# Patient Record
Sex: Male | Born: 1970 | Race: Black or African American | Hispanic: No | Marital: Single | Smoking: Current some day smoker
Health system: Southern US, Community
[De-identification: ages and names within clinical notes are randomized; demographics above are authoritative.]

## PROBLEM LIST (undated history)

## (undated) DIAGNOSIS — R42 Dizziness and giddiness: Secondary | ICD-10-CM

---

## 2003-03-15 HISTORY — PX: HAND SURGERY: SHX662

## 2006-07-14 HISTORY — PX: TESTICLE SURGERY: SHX794

## 2006-09-07 ENCOUNTER — Other Ambulatory Visit: Payer: Self-pay

## 2006-09-07 ENCOUNTER — Emergency Department: Payer: Self-pay | Admitting: Emergency Medicine

## 2007-05-04 ENCOUNTER — Ambulatory Visit: Payer: Self-pay | Admitting: Urology

## 2008-03-16 IMAGING — CR DG CHEST 2V
1 series · 3 of 3 positions shown · non-contrast
Comparison: none

REASON FOR EXAM: Chest pain
COMMENTS:

PROCEDURE:     DXR - DXR CHEST PA (OR AP) AND LATERAL  - September 07, 2006  [DATE]
RESULT:     PA and lateral views of the chest show the lung fields to be
clear. The heart, mediastinal and osseous structures show no acute changes.
Monitoring electrodes are present.

[Series 1: view not recorded · 0.17mm/px · 3 of 3 slices shown]
[im 1/3]
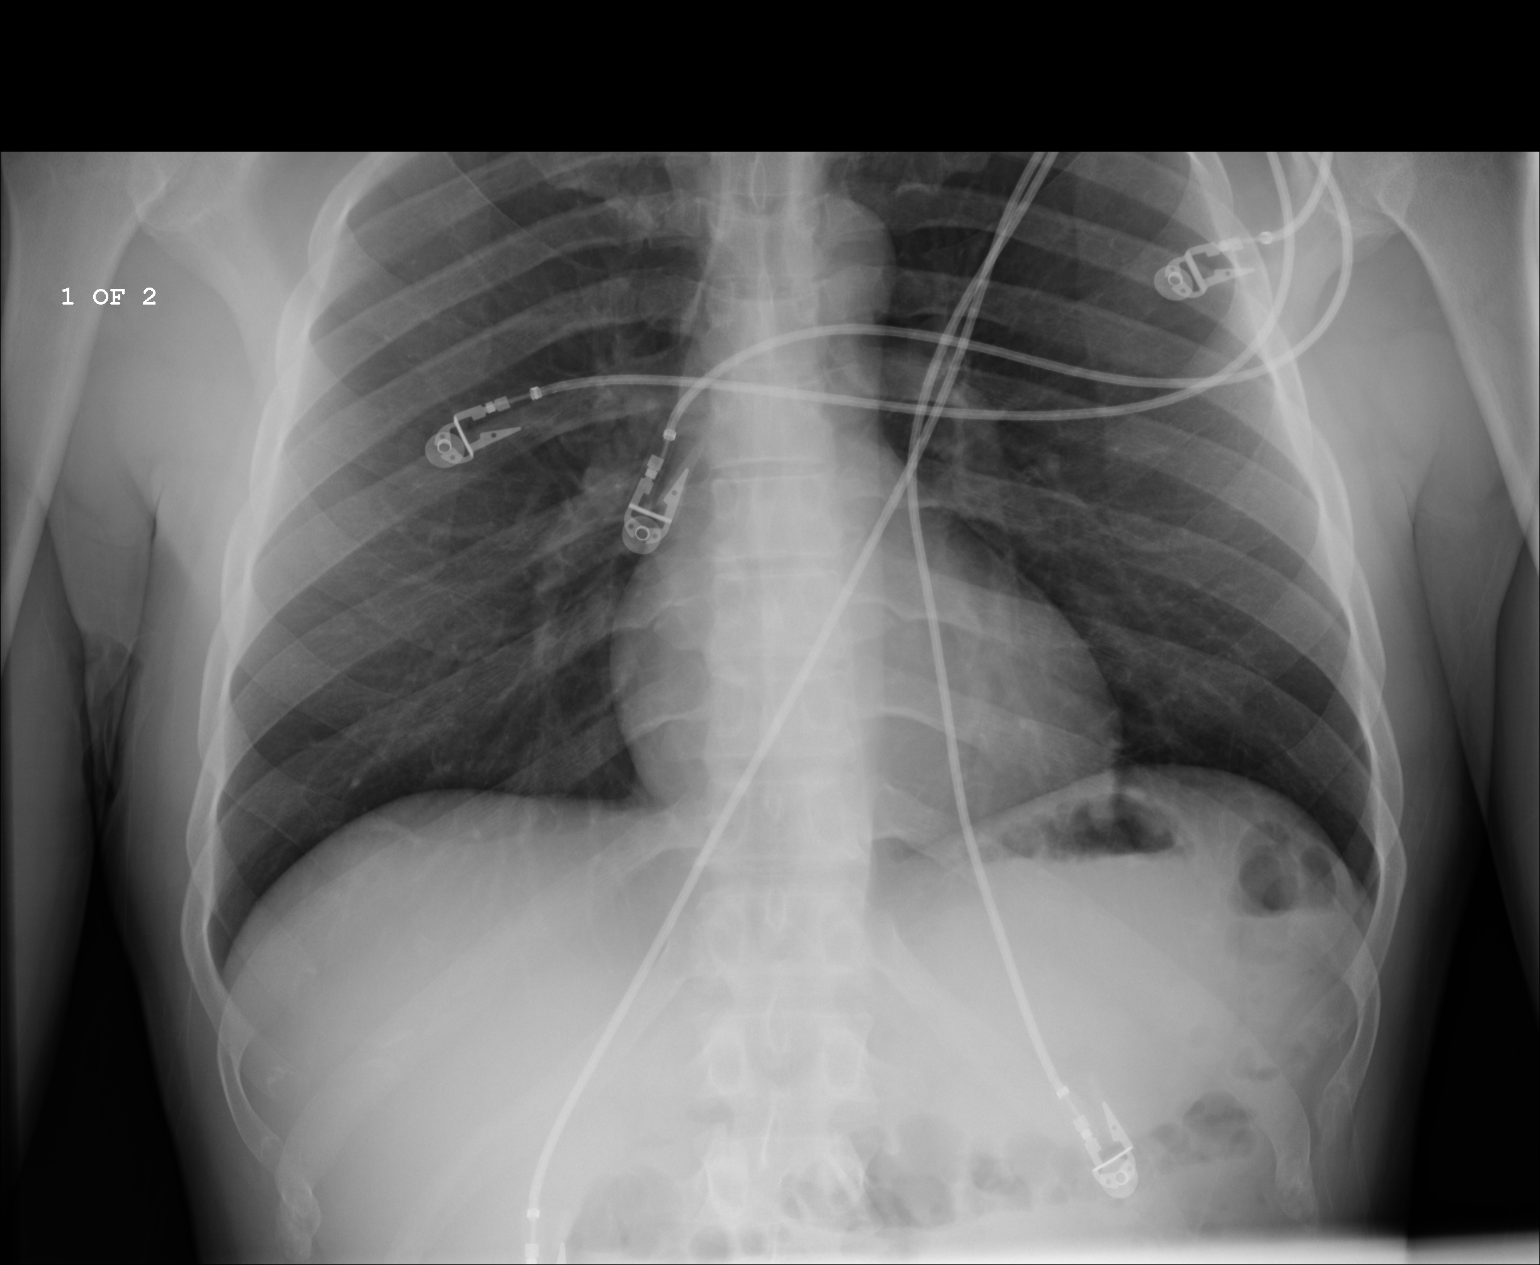
[im 2/3]
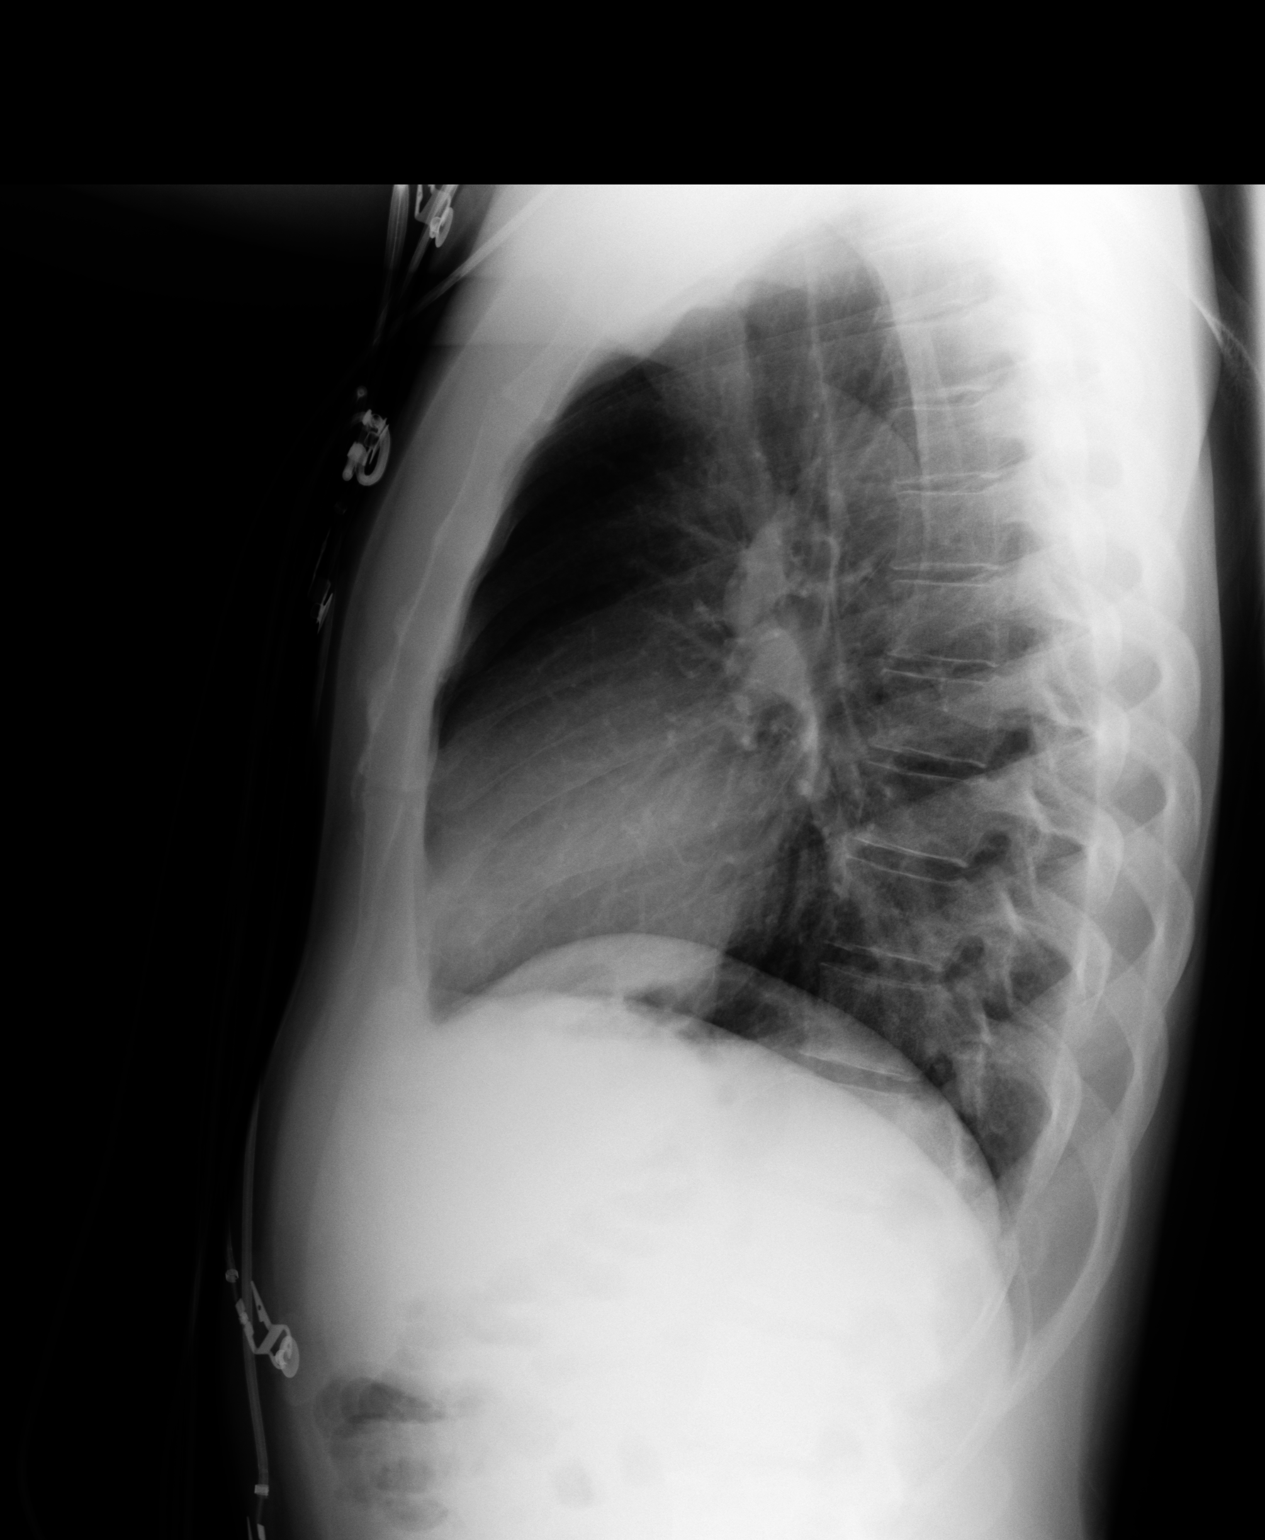
[im 3/3]
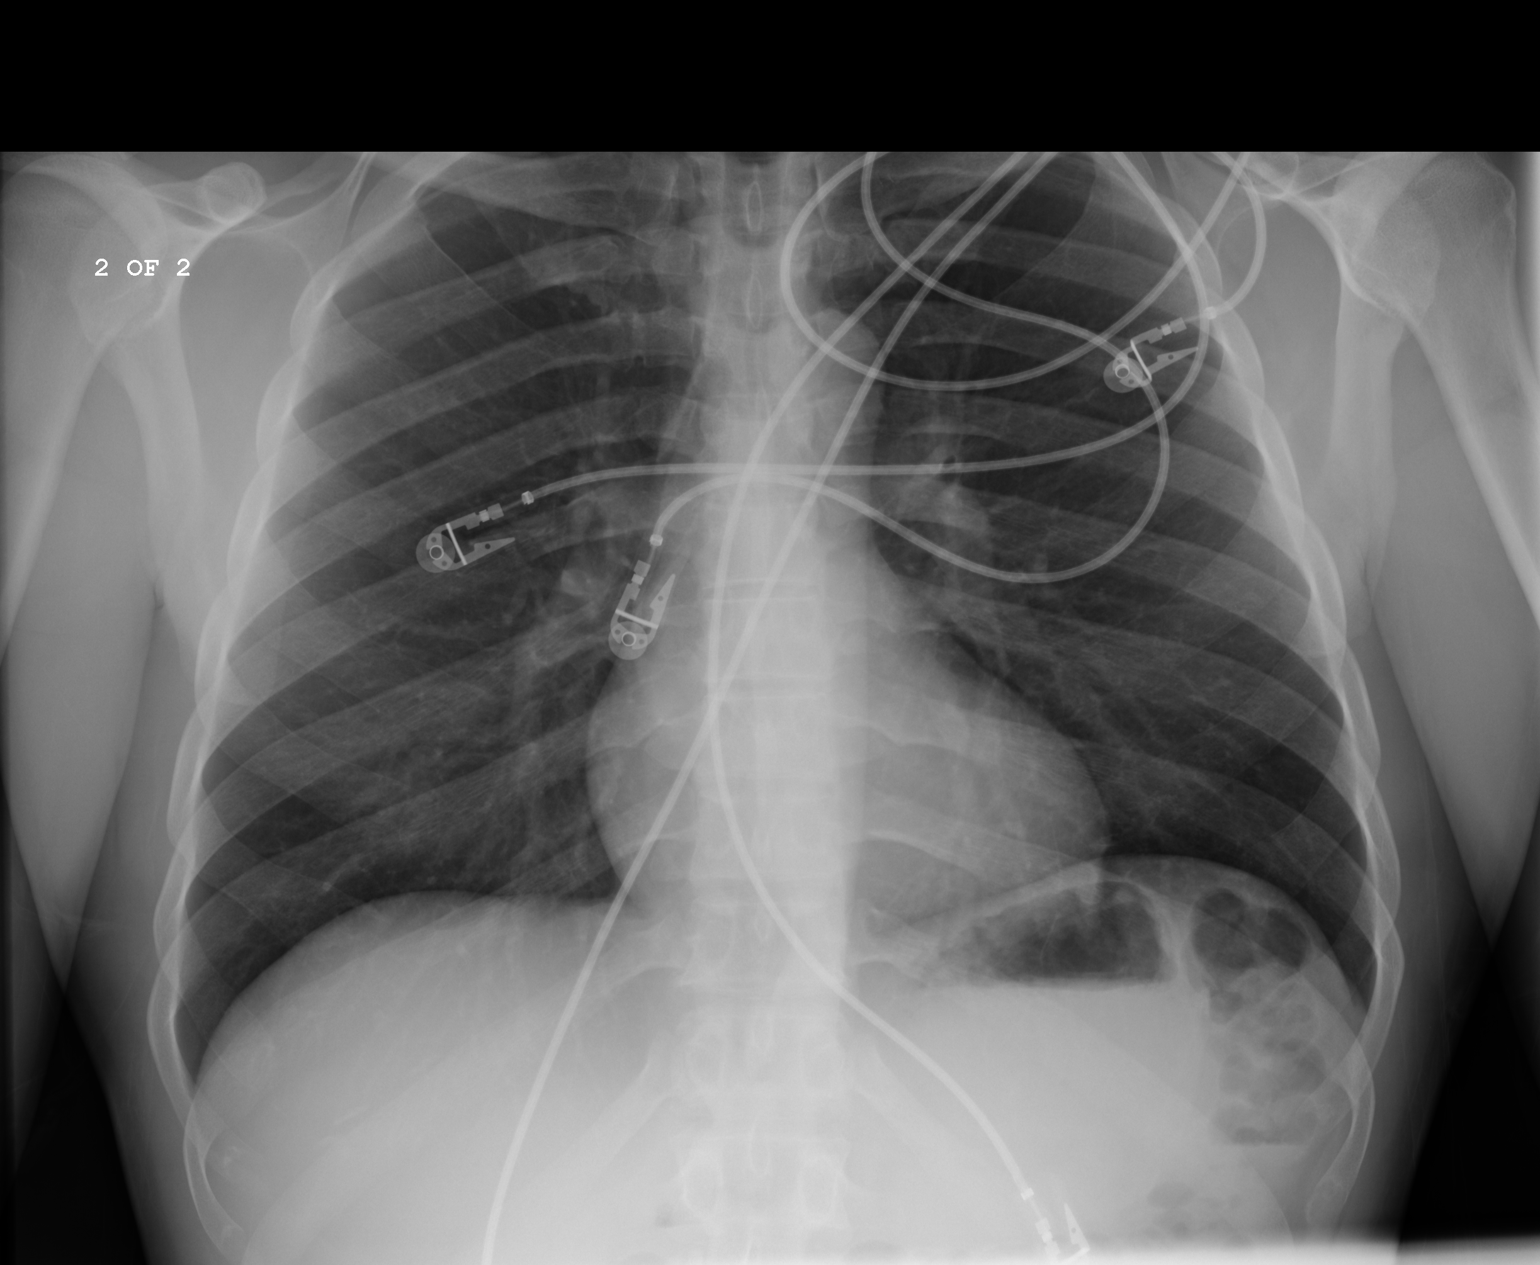

[3 of 3 positions shown; findings below may reference images not displayed]

IMPRESSION: No acute changes are identified.

## 2011-04-03 ENCOUNTER — Ambulatory Visit: Payer: Self-pay | Admitting: Urology

## 2011-06-17 ENCOUNTER — Ambulatory Visit: Payer: Self-pay

## 2011-07-04 ENCOUNTER — Ambulatory Visit: Payer: Self-pay | Admitting: Internal Medicine

## 2015-01-01 ENCOUNTER — Telehealth: Payer: Self-pay | Admitting: Family Medicine

## 2015-01-01 NOTE — Telephone Encounter (Signed)
Patient called and stated he is having knee surgery on June 30 and would like Dr. Thana Ates to fill out paperwork for surgical clearance. Patient was informed that he transferred to a new provider and has not been here in a year that the new provider must do his surgical clearance. He is no longer consider a patient her. Patient stated he undertood and will call his new provider.

## 2015-01-01 NOTE — Telephone Encounter (Signed)
PT SAID THAT HE SWITCHED PROVIDERS IN FEB BUT IS NEEDING A FORM COMPLETED FOR HIM TO HAVE SURGERY SINCE HE WAS SEEING HIM FOR SO LONG.PT SAID THAT HE WILL PAY FOR THIS IF IS NEEDED. PLEASE CALL AND ADVISE. PT # [phone]

## 2015-01-03 ENCOUNTER — Encounter: Payer: Self-pay | Admitting: *Deleted

## 2015-01-10 NOTE — Discharge Instructions (Signed)
[facility] °[facility] ° °POST OPERATIVE INSTRUCTIONS FOR DR. TROXLER AND DR. FOWLER °[facility] ° ° °1. Take your medication as prescribed.  Pain medication should be taken only as needed. ° °2. Keep the dressing clean, dry and intact. ° °3. Keep your foot elevated above the heart level for the first 48 hours. ° °4. Walking to the bathroom and brief periods of walking are acceptable, unless we have instructed you to be non-weight bearing. ° °5. Always wear your post-op shoe when walking.  Always use your crutches if you are to be non-weight bearing. ° °6. Do not take a shower. Baths are permissible as long as the foot is kept out of the water.  ° °7. Every hour you are awake:  °- Bend your knee 15 times. °- Flex foot 15 times °- Massage calf 15 times ° °8. Call [facility] ([phone]) if any of the following problems occur: °- You develop a temperature or fever. °- The bandage becomes saturated with blood. °- Medication does not stop your pain. °- Injury of the foot occurs. °- Any symptoms of infection including redness, odor, or red streaks running from wound. °-  ° °General Anesthesia, Care After °Refer to this sheet in the next few weeks. These instructions provide you with information on caring for yourself after your procedure. Your health care provider may also give you more specific instructions. Your treatment has been planned according to current medical practices, but problems sometimes occur. Call your health care provider if you have any problems or questions after your procedure. °WHAT TO EXPECT AFTER THE PROCEDURE °After the procedure, it is typical to experience: °· Sleepiness. °· Nausea and vomiting. °HOME CARE INSTRUCTIONS °· For the first 24 hours after general anesthesia: °¨ Have a responsible person with you. °¨ Do not drive a car. If you are alone, do not take public transportation. °¨ Do not drink alcohol. °¨ Do not take medicine  that has not been prescribed by your health care provider. °¨ Do not sign important papers or make important decisions. °¨ You may resume a normal diet and activities as directed by your health care provider. °· Change bandages (dressings) as directed. °· If you have questions or problems that seem related to general anesthesia, call the hospital and ask for the anesthetist or anesthesiologist on call. °SEEK MEDICAL CARE IF: °· You have nausea and vomiting that continue the day after anesthesia. °· You develop a rash. °SEEK IMMEDIATE MEDICAL CARE IF:  °· You have difficulty breathing. °· You have chest pain. °· You have any allergic problems. °Document Released: 10/06/2000 Document Revised: 07/05/2013 Document Reviewed: 01/13/2013 °ExitCare® Patient Information ©2015 [employer]. This information is not intended to replace advice given to you by your health care provider. Make sure you discuss any questions you have with your health care provider. ° °

## 2015-01-11 ENCOUNTER — Ambulatory Visit: Payer: Managed Care, Other (non HMO) | Admitting: Anesthesiology

## 2015-01-11 ENCOUNTER — Ambulatory Visit
Admission: RE | Admit: 2015-01-11 | Discharge: 2015-01-11 | Disposition: A | Discharge status: Home / Self Care | Payer: Managed Care, Other (non HMO) | Source: Ambulatory Visit | Attending: Podiatry | Admitting: Podiatry

## 2015-01-11 ENCOUNTER — Encounter
Admission: RE | Disposition: A | Discharge status: Home / Self Care | Payer: Self-pay | Source: Ambulatory Visit | Attending: Podiatry

## 2015-01-11 DIAGNOSIS — G8929 Other chronic pain: Secondary | ICD-10-CM | POA: Insufficient documentation

## 2015-01-11 DIAGNOSIS — Z79899 Other long term (current) drug therapy: Secondary | ICD-10-CM | POA: Diagnosis not present

## 2015-01-11 DIAGNOSIS — M7661 Achilles tendinitis, right leg: Secondary | ICD-10-CM | POA: Insufficient documentation

## 2015-01-11 DIAGNOSIS — M7660 Achilles tendinitis, unspecified leg: Secondary | ICD-10-CM | POA: Diagnosis present

## 2015-01-11 DIAGNOSIS — Z87891 Personal history of nicotine dependence: Secondary | ICD-10-CM | POA: Insufficient documentation

## 2015-01-11 HISTORY — PX: ACHILLES TENDON SURGERY: SHX542

## 2015-01-11 HISTORY — PX: CALCANEAL OSTEOTOMY: SHX1281

## 2015-01-11 HISTORY — DX: Dizziness and giddiness: R42

## 2015-01-11 SURGERY — REPAIR, TENDON, ACHILLES
Anesthesia: General | Laterality: Right

## 2015-01-11 MED ORDER — PROPOFOL 10 MG/ML IV BOLUS
INTRAVENOUS | Status: DC | PRN
  Administered 2015-01-11: 50 mg via INTRAVENOUS
  Administered 2015-01-11: 150 mg via INTRAVENOUS

## 2015-01-11 MED ORDER — ROCURONIUM BROMIDE 100 MG/10ML IV SOLN
INTRAVENOUS | Status: DC | PRN
  Administered 2015-01-11: 30 mg via INTRAVENOUS

## 2015-01-11 MED ORDER — HYDROMORPHONE HCL 1 MG/ML IJ SOLN: 0.2500 mg | INTRAMUSCULAR | Status: DC | PRN

## 2015-01-11 MED ORDER — CEFAZOLIN SODIUM-DEXTROSE 2-3 GM-% IV SOLR
2.0000 g | Freq: Once | INTRAVENOUS | Status: AC
  Administered 2015-01-11: 2 g via INTRAVENOUS

## 2015-01-11 MED ORDER — MIDAZOLAM HCL 2 MG/2ML IJ SOLN
INTRAMUSCULAR | Status: DC | PRN
  Administered 2015-01-11 (×2): 2 mg via INTRAVENOUS

## 2015-01-11 MED ORDER — DEXAMETHASONE SODIUM PHOSPHATE 4 MG/ML IJ SOLN
INTRAMUSCULAR | Status: DC | PRN
  Administered 2015-01-11 (×2): 4 mg via INTRAVENOUS

## 2015-01-11 MED ORDER — ACETAMINOPHEN 160 MG/5ML PO SOLN: 325.0000 mg | ORAL | Status: DC | PRN

## 2015-01-11 MED ORDER — FENTANYL CITRATE (PF) 100 MCG/2ML IJ SOLN
INTRAMUSCULAR | Status: DC | PRN
  Administered 2015-01-11: 50 ug via INTRAVENOUS

## 2015-01-11 MED ORDER — OXYCODONE HCL 5 MG/5ML PO SOLN: 5.0000 mg | Freq: Once | ORAL | Status: DC | PRN

## 2015-01-11 MED ORDER — OXYCODONE-ACETAMINOPHEN 7.5-325 MG PO TABS: 1.0000 | ORAL_TABLET | ORAL | Status: DC | PRN

## 2015-01-11 MED ORDER — GLYCOPYRROLATE 0.2 MG/ML IJ SOLN
INTRAMUSCULAR | Status: DC | PRN
  Administered 2015-01-11: .1 mg via INTRAVENOUS

## 2015-01-11 MED ORDER — OXYCODONE HCL 5 MG PO TABS: 5.0000 mg | ORAL_TABLET | Freq: Once | ORAL | Status: DC | PRN

## 2015-01-11 MED ORDER — ONDANSETRON HCL 4 MG/2ML IJ SOLN
INTRAMUSCULAR | Status: DC | PRN
  Administered 2015-01-11: 4 mg via INTRAVENOUS

## 2015-01-11 MED ORDER — LACTATED RINGERS IV SOLN
INTRAVENOUS | Status: DC
  Administered 2015-01-11 (×2): via INTRAVENOUS

## 2015-01-11 MED ORDER — ACETAMINOPHEN 325 MG PO TABS: 325.0000 mg | ORAL_TABLET | ORAL | Status: DC | PRN

## 2015-01-11 MED ORDER — LIDOCAINE HCL (CARDIAC) 20 MG/ML IV SOLN
INTRAVENOUS | Status: DC | PRN
  Administered 2015-01-11: 50 mg via INTRAVENOUS

## 2015-01-11 MED ORDER — ONDANSETRON HCL 4 MG/2ML IJ SOLN: 4.0000 mg | Freq: Once | INTRAMUSCULAR | Status: DC | PRN

## 2015-01-11 SURGICAL SUPPLY — 35 items
ANCHOR ALL-SUT Q-FIX 2.8 (Anchor) ×6 IMPLANT
BANDAGE ELASTIC 4 CLIP NS LF (GAUZE/BANDAGES/DRESSINGS) ×6 IMPLANT
BLADE OSCILLATING/SAGITTAL (BLADE) ×2
BLADE SURG 15 STRL LF DISP TIS (BLADE) ×2 IMPLANT
BLADE SURG 15 STRL SS (BLADE) ×4
BLADE SURG MINI STRL (BLADE) ×3 IMPLANT
BLADE SW THK.38XMED LNG THN (BLADE) ×1 IMPLANT
BNDG ESMARK 4X12 TAN STRL LF (GAUZE/BANDAGES/DRESSINGS) ×3 IMPLANT
CANISTER SUCT 1200ML W/VALVE (MISCELLANEOUS) ×3 IMPLANT
CLOSURE WOUND 1/4X4 (GAUZE/BANDAGES/DRESSINGS) ×1
CUFF TOURN SGL QUICK 24 (TOURNIQUET CUFF) ×2
CUFF TRNQT CYL 24X4X40X1 (TOURNIQUET CUFF) ×1 IMPLANT
DURAPREP 26ML APPLICATOR (WOUND CARE) ×3 IMPLANT
GAUZE PETRO XEROFOAM 1X8 (MISCELLANEOUS) ×3 IMPLANT
GAUZE SPONGE 4X4 12PLY STRL (GAUZE/BANDAGES/DRESSINGS) ×3 IMPLANT
GLOVE BIO SURGEON STRL SZ8 (GLOVE) ×3 IMPLANT
GLOVE INDICATOR 7.5 STRL GRN (GLOVE) ×3 IMPLANT
GOWN STRL REUS W/ TWL LRG LVL3 (GOWN DISPOSABLE) ×2 IMPLANT
GOWN STRL REUS W/TWL LRG LVL3 (GOWN DISPOSABLE) ×4
KIT SUTURE 2.8 Q-FIX DISP (MISCELLANEOUS) ×3 IMPLANT
NEEDLE FILTER BLUNT 18X 1/2SAF (NEEDLE) ×2
NEEDLE FILTER BLUNT 18X1 1/2 (NEEDLE) ×1 IMPLANT
NEEDLE HYPO 25GX1X1/2 BEV (NEEDLE) ×9 IMPLANT
NS IRRIG 500ML POUR BTL (IV SOLUTION) ×3 IMPLANT
PACK EXTREMITY ARMC (MISCELLANEOUS) ×3 IMPLANT
PAD GROUND ADULT SPLIT (MISCELLANEOUS) ×3 IMPLANT
PENCIL ELECTRO HAND CTR (MISCELLANEOUS) ×3 IMPLANT
RASP SM TEAR CROSS CUT (RASP) ×3 IMPLANT
STOCKINETTE STRL 6IN 960660 (GAUZE/BANDAGES/DRESSINGS) ×3 IMPLANT
STRIP CLOSURE SKIN 1/4X4 (GAUZE/BANDAGES/DRESSINGS) ×2 IMPLANT
SUT VIC AB 3-0 SH 27 (SUTURE) ×2
SUT VIC AB 3-0 SH 27X BRD (SUTURE) ×1 IMPLANT
SUT VIC AB 4-0 FS2 27 (SUTURE) ×3 IMPLANT
SYR 3ML LL SCALE MARK (SYRINGE) ×3 IMPLANT
SYRINGE 10CC LL (SYRINGE) ×6 IMPLANT

## 2015-01-11 NOTE — Anesthesia Procedure Notes (Addendum)
Anesthesia Regional Block:  Popliteal block  Pre-Anesthetic Checklist: ,, timeout performed, Correct Patient, Correct Site, Correct Laterality, Correct Procedure, Correct Position, site marked, Risks and benefits discussed,  Surgical consent,  Pre-op evaluation,  At surgeon's request and post-op pain management   Prep: chloraprep       Needles:  Injection technique: Single-shot  Needle Type: Echogenic Needle     Needle Length: 9cm 9 cm Needle Gauge: 21 and 21 G    Additional Needles:  Procedures: ultrasound guided (picture in chart) Popliteal block Narrative:  Start time: 01/11/2015 7:10 AM End time: 01/11/2015 7:20 AM Injection made incrementally with aspirations every 35 mL.  Performed by: Personally  Anesthesiologist: BACON, DAVID  Additional Notes: Functioning IV was confirmed and monitors applied. Ultrasound guidance: relevant anatomy identified, needle position confirmed, local anesthetic spread visualized around nerve(s)., vascular puncture avoided.  Image printed for medical record.  Negative aspiration and no paresthesias; incremental administration of local anesthetic. The patient tolerated the procedure well. Vitals signes recorded in RN notes.   Procedure Name: Intubation Date/Time: 01/11/2015 7:51 AM Performed by: Jimmy PicketAMYOT, Dorothy Landgrebe Pre-anesthesia Checklist: Patient identified, Emergency Drugs available, Suction available, Patient being monitored and Timeout performed Patient Re-evaluated:Patient Re-evaluated prior to inductionOxygen Delivery Method: Circle system utilized Preoxygenation: Pre-oxygenation with 100% oxygen Intubation Type: IV induction Ventilation: Mask ventilation without difficulty Grade View: Grade I Tube type: Oral Tube size: 7.5 mm Number of attempts: 2 Placement Confirmation: ETT inserted through vocal cords under direct vision,  positive ETCO2 and breath sounds checked- equal and bilateral Secured at: 22 cm Tube secured with: Tape Dental  Injury: Teeth and Oropharynx as per pre-operative assessment

## 2015-01-11 NOTE — Anesthesia Preprocedure Evaluation (Addendum)
Anesthesia Evaluation  Patient identified by MRN, date of birth, ID band Patient awake    Reviewed: Allergy & Precautions, H&P , NPO status   Airway Mallampati: I  TM Distance: >3 FB Neck ROM: full    Dental no notable dental hx.    Pulmonary former smoker,    Pulmonary exam normal       Cardiovascular negative cardio ROS Normal cardiovascular exam    Neuro/Psych    GI/Hepatic negative GI ROS, Neg liver ROS,   Endo/Other  negative endocrine ROS  Renal/GU negative Renal ROS     Musculoskeletal   Abdominal   Peds  Hematology negative hematology ROS (+)   Anesthesia Other Findings   Reproductive/Obstetrics                           Anesthesia Physical Anesthesia Plan  ASA: I  Anesthesia Plan: General   Post-op Pain Management: MAC Combined w/ Regional for Post-op pain   Induction: Intravenous  Airway Management Planned: Oral ETT  Additional Equipment:   Intra-op Plan:   Post-operative Plan: Extubation in OR  Informed Consent: I have reviewed the patients History and Physical, chart, labs and discussed the procedure including the risks, benefits and alternatives for the proposed anesthesia with the patient or authorized representative who has indicated his/her understanding and acceptance.     Plan Discussed with: CRNA  Anesthesia Plan Comments:         Anesthesia Quick Evaluation

## 2015-01-11 NOTE — Op Note (Signed)
Operative note   Surgeon: Dr. Recardo EvangelistMatthew Geoffrey Hynes, DPM.    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 Secondary repair of Achilles tendon right heel   2. Exostectomy right calcaneus       EBL: Less than 10 cc    Anesthesia:general with popliteal block    Hemostasis: Thigh tourniquet 325 mmHg pressure    Specimen: Degenerative tendon and bone from the posterior right heel at the Achilles insertional region.    Complications: None    Operative indications: Chronic pain unresponsive to conservative care    Procedure:  Patient was brought into the OR and placed on the operating table in theprone position. After anesthesia was obtained theright lower extremity was prepped and draped in usual sterile fashion.  Operative Report: At this time attention was directed to the posterior right heel where a 4 cm posterior linear skin incision was made over the midsection of the posterior calcaneus. This incision was deepened sharp blunt dissection bleeders clamped and bovied as required. Careful dissection was made at this point with care taken to identify the tendon sheath and peritenon layers and these were dissected and retracted mediolaterally. Incision was made through the tendon down to bone at this point from proximal to distal. The tendon was then dissected away from a large bony prominence on the posterior calcaneus. This was seen to be somewhat mobile with dissection was not directly attached to the bone but was all intratendinous. This large section of bone was removed. Several other areas of bony prominence were noted on the posterior calcaneus these needed to be resected with a combination of sagittal saw and rasping. A noted Haglund's deformity and prominence to the dorsal posterior calcaneus was noted this time is resected and rasped smoothly. There is checked with FluoroScan and good reduction removal of all  hypertrophied bone was noted from the region.  At this time after evaluation noting that there was good raw bone on the posterior calcaneus, 2 drill holes were made for the insertion of the 2.8  Quickfix tendon anchor from [employer]. One was placed inferior and one superior and the posterior calcaneus. Suture material was then utilized to reattach the dissected tendon back down to bone on both areas. The suture was part of the quick fix system. Once the tendon was reattached to bone the remaining incision margin the tendon was closed with 4 Vicryl a combination of simple interrupted and a continuous stitch. This was following copious irrigation prior to anchor placement. Irrigation was done with sterile Neosporin solution.  After the tendon was reapproximated and anchored the peritenon was closed with 4 Vicryl continuous stitch as was the tendon sheath. Superficial and deep fascia was enclosed with 4-0 Vicryl in continuous stitch and skin closed with 4 Vicryl in a subcuticular fashion. At this time a sterile compressive dressing was placed across wound consisting of Steri-Strips Xeroform gauze 4 x 4's Kling and Kerlix. The tourniquet was released and prompt complete vascularity was seen to return all digits of the right foot. A below-knee cast was placed on the right foot and leg in the operating room with the foot and ankle in a slightly plantarflexed position.    Patient tolerated the procedure and anesthesia well.  Was transported from the OR to the PACU with all vital signs stable and vascular status intact. To be discharged per routine protocol.  Will follow up  in approximately 1 week in the outpatient clinic.

## 2015-01-11 NOTE — Transfer of Care (Signed)
Immediate Anesthesia Transfer of Care Note  Patient: Brian Baird  Procedure(s) Performed: Procedure(s) with comments: ACHILLES TENDON REPAIR (Right) - LMA WITH POPLITEAL BLOCK CALCANEAL OSTEctomy (Right)  Patient Location: PACU  Anesthesia Type: General  Level of Consciousness: awake, alert  and patient cooperative  Airway and Oxygen Therapy: Patient Spontanous Breathing and Patient connected to supplemental oxygen  Post-op Assessment: Post-op Vital signs reviewed, Patient's Cardiovascular Status Stable, Respiratory Function Stable, Patent Airway and No signs of Nausea or vomiting  Post-op Vital Signs: Reviewed and stable  Complications: No apparent anesthesia complications

## 2015-01-11 NOTE — Anesthesia Postprocedure Evaluation (Signed)
  Anesthesia Post-op Note  Patient: Brian Baird  Procedure(s) Performed: Procedure(s) with comments: ACHILLES TENDON REPAIR (Right) - LMA WITH POPLITEAL BLOCK CALCANEAL OSTEctomy (Right)  Anesthesia type:General  Patient location: PACU  Post pain: Pain level controlled  Post assessment: Post-op Vital signs reviewed, Patient's Cardiovascular Status Stable, Respiratory Function Stable, Patent Airway and No signs of Nausea or vomiting  Post vital signs: Reviewed and stable  Last Vitals:  Filed Vitals:   01/11/15 0942  BP: 112/80  Pulse: 79  Temp: 36.3 C  Resp: 12    Level of consciousness: awake, alert  and patient cooperative  Complications: No apparent anesthesia complications

## 2015-01-16 LAB — SURGICAL PATHOLOGY

## 2015-01-23 ENCOUNTER — Ambulatory Visit: Payer: Self-pay | Admitting: Family Medicine

## 2015-01-23 ENCOUNTER — Encounter: Payer: Self-pay | Admitting: Family Medicine

## 2017-05-07 ENCOUNTER — Emergency Department
Admission: EM | Admit: 2017-05-07 | Discharge: 2017-05-07 | Disposition: A | Discharge status: Home / Self Care | Payer: 59 | Attending: Emergency Medicine | Admitting: Emergency Medicine

## 2017-05-07 DIAGNOSIS — Y999 Unspecified external cause status: Secondary | ICD-10-CM | POA: Diagnosis not present

## 2017-05-07 DIAGNOSIS — S161XXA Strain of muscle, fascia and tendon at neck level, initial encounter: Secondary | ICD-10-CM | POA: Diagnosis not present

## 2017-05-07 DIAGNOSIS — Y939 Activity, unspecified: Secondary | ICD-10-CM | POA: Insufficient documentation

## 2017-05-07 DIAGNOSIS — S0003XA Contusion of scalp, initial encounter: Secondary | ICD-10-CM | POA: Diagnosis not present

## 2017-05-07 DIAGNOSIS — Y92410 Unspecified street and highway as the place of occurrence of the external cause: Secondary | ICD-10-CM | POA: Diagnosis not present

## 2017-05-07 DIAGNOSIS — Z87891 Personal history of nicotine dependence: Secondary | ICD-10-CM | POA: Diagnosis not present

## 2017-05-07 DIAGNOSIS — S199XXA Unspecified injury of neck, initial encounter: Secondary | ICD-10-CM | POA: Diagnosis present

## 2017-05-07 MED ORDER — HYDROCODONE-ACETAMINOPHEN 5-325 MG PO TABS: 1.0000 | ORAL_TABLET | Freq: Four times a day (QID) | ORAL | 0 refills | Status: DC | PRN

## 2017-05-07 MED ORDER — IBUPROFEN 600 MG PO TABS: 600.0000 mg | ORAL_TABLET | Freq: Three times a day (TID) | ORAL | 0 refills | Status: DC | PRN

## 2017-05-07 MED ORDER — CYCLOBENZAPRINE HCL 5 MG PO TABS: 5.0000 mg | ORAL_TABLET | Freq: Three times a day (TID) | ORAL | 0 refills | Status: DC | PRN

## 2017-05-07 MED ORDER — HYDROCODONE-ACETAMINOPHEN 5-325 MG PO TABS
1.0000 | ORAL_TABLET | Freq: Once | ORAL | Status: DC
  Filled 2017-05-07: qty 1

## 2017-05-07 NOTE — Discharge Instructions (Signed)
Follow-up with your primary care provider if any continued problems. Call and make an appointment. Begin taking Norco as needed for pain and inflammation. Robaxin only if needed for muscle spasms. Moist heat or ice to your muscles as needed for discomfort.

## 2017-05-07 NOTE — ED Notes (Signed)
Patient does not appear to be in any acute distress at time of discharge. Patient ambulatory to lobby with steady gate. Patient denies any comments or concerns regarding discharge.  
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
## 2017-05-08 ENCOUNTER — Emergency Department: Payer: 59

## 2017-05-08 ENCOUNTER — Emergency Department
Admission: EM | Admit: 2017-05-08 | Discharge: 2017-05-08 | Disposition: A | Discharge status: Home / Self Care | Payer: 59 | Attending: Emergency Medicine | Admitting: Emergency Medicine

## 2017-05-08 DIAGNOSIS — S060X0A Concussion without loss of consciousness, initial encounter: Secondary | ICD-10-CM | POA: Insufficient documentation

## 2017-05-08 DIAGNOSIS — Z87891 Personal history of nicotine dependence: Secondary | ICD-10-CM | POA: Insufficient documentation

## 2017-05-08 DIAGNOSIS — Y999 Unspecified external cause status: Secondary | ICD-10-CM | POA: Insufficient documentation

## 2017-05-08 DIAGNOSIS — Y92410 Unspecified street and highway as the place of occurrence of the external cause: Secondary | ICD-10-CM | POA: Diagnosis not present

## 2017-05-08 DIAGNOSIS — S0990XA Unspecified injury of head, initial encounter: Secondary | ICD-10-CM | POA: Diagnosis present

## 2017-05-08 DIAGNOSIS — Y939 Activity, unspecified: Secondary | ICD-10-CM | POA: Insufficient documentation

## 2017-05-08 MED ORDER — KETOROLAC TROMETHAMINE 30 MG/ML IJ SOLN
30.0000 mg | Freq: Once | INTRAMUSCULAR | Status: AC
  Administered 2017-05-08: 30 mg via INTRAMUSCULAR
  Filled 2017-05-08: qty 1
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
## 2019-07-11 DIAGNOSIS — M7660 Achilles tendinitis, unspecified leg: Secondary | ICD-10-CM | POA: Insufficient documentation

## 2020-12-25 ENCOUNTER — Encounter: Payer: Self-pay | Admitting: Podiatry

## 2020-12-25 ENCOUNTER — Other Ambulatory Visit: Payer: Self-pay

## 2020-12-25 ENCOUNTER — Ambulatory Visit: Payer: 59 | Admitting: Podiatry

## 2020-12-25 DIAGNOSIS — B351 Tinea unguium: Secondary | ICD-10-CM

## 2020-12-25 MED ORDER — TERBINAFINE HCL 250 MG PO TABS: 250.0000 mg | ORAL_TABLET | Freq: Every day | ORAL | 0 refills | Status: DC

## 2020-12-25 NOTE — Progress Notes (Signed)
   Subjective: 50 y.o. male presenting today for an issue regarding a possible ingrown toenail to the medial border of the left third toe.  He states that it was tender for a few weeks however he trimmed it recently and it felt significantly better.  Currently there is no pain to this area. He also presents to discuss the thickening and discoloration to the toenails bilateral.  Patient states that they have been like that for several years.  He has not done anything for treatment.  He presents for further treatment evaluation  Past Medical History:  Diagnosis Date   Vertigo    1 episode 08/2014    Objective: Physical Exam General: The patient is alert and oriented x3 in no acute distress.  Dermatology: Hyperkeratotic, discolored, thickened, onychodystrophy noted to the toenails 1-5 left foot as well as some intermittent portions of nail to the right foot. Skin is warm, dry and supple bilateral lower extremities. Negative for open lesions or macerations.  Vascular: Palpable pedal pulses bilaterally. No edema or erythema noted. Capillary refill within normal limits.  Neurological: Epicritic and protective threshold grossly intact bilaterally.   Musculoskeletal Exam: Range of motion within normal limits to all pedal and ankle joints bilateral. Muscle strength 5/5 in all groups bilateral.   Assessment: #1 Onychomycosis of toenails bilateral   Plan of Care:  #1 Patient was evaluated. #2  Today we discussed different treatment options including oral, topical, and laser antifungal treatment modalities.  After discussing the efficacies and risks and benefits associated to each modality, patient opts for oral antifungal medication.  He denies a history of liver pathology or symptoms. #3 prescription for Lamisil turned 50 mg #90 daily #4 return to clinic in 6 months   Felecia Shelling, DPM [facility]  Dr. Felecia Shelling, DPM    [address] [place], [place] [address]                Office [phone]  Fax [phone]

## 2021-03-22 ENCOUNTER — Other Ambulatory Visit: Payer: Self-pay | Admitting: Podiatry

## 2021-05-14 ENCOUNTER — Other Ambulatory Visit: Payer: Self-pay

## 2021-05-14 ENCOUNTER — Ambulatory Visit (INDEPENDENT_AMBULATORY_CARE_PROVIDER_SITE_OTHER): Payer: 59

## 2021-05-14 ENCOUNTER — Encounter: Payer: Self-pay | Admitting: Podiatry

## 2021-05-14 ENCOUNTER — Ambulatory Visit (INDEPENDENT_AMBULATORY_CARE_PROVIDER_SITE_OTHER): Payer: 59 | Admitting: Podiatry

## 2021-05-14 DIAGNOSIS — M21622 Bunionette of left foot: Secondary | ICD-10-CM

## 2021-05-14 NOTE — Progress Notes (Signed)
   HPI: 50 y.o. male presenting today for new complaint regarding pain and tenderness associated to the left lateral forefoot.  Patient states that he purchased a new pair of steel toed work boots a few months ago and slowly over time he has developed significant pain and tenderness to the left foot area.  He denies a history of trauma.  He has not anything for treatment at the moment.  He presents for further treatment and evaluation  Past Medical History:  Diagnosis Date   Vertigo    1 episode 08/2014     Physical Exam: General: The patient is alert and oriented x3 in no acute distress.  Dermatology: Skin is warm, dry and supple bilateral lower extremities. Negative for open lesions or macerations.  Vascular: Palpable pedal pulses bilaterally. No edema or erythema noted. Capillary refill within normal limits.  Neurological: Epicritic and protective threshold grossly intact bilaterally.   Musculoskeletal Exam: Range of motion within normal limits to all pedal and ankle joints bilateral. Muscle strength 5/5 in all groups bilateral.  Clinical evidence of a tailor's bunionette deformity noted with a prominent fifth metatarsal head to the left foot and associated tenderness palpation  Radiographic Exam:  Normal osseous mineralization. Joint spaces preserved. No fracture/dislocation/boney destruction.  There is an increased intermetatarsal angle between the fourth and fifth metatarsals with lateral deviation of the metatarsal head noted  Assessment: 1.  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 Patient evaluated. X-Rays reviewed.  2.  Patient mitts to getting a new pair of shoes a few months ago which may have elicited the patient's pain.  Advised him to discontinue wearing the shoes.  Patient also believes that the steel toed/composite boots that he wears at work are contributing to the patient's pain. 3.  Note was provided today to refrain from steel  toed or composite safety boots x2 months.  Patient may resume standard safety shoes beginning 07/14/2021 4.  Return to clinic as needed      Felecia Shelling, DPM [facility]  Dr. Felecia Shelling, DPM    [address] [place], [place] [address]                Office [phone]  Fax [phone]

## 2021-07-02 ENCOUNTER — Ambulatory Visit: Payer: 59 | Admitting: Podiatry

## 2021-07-12 ENCOUNTER — Encounter: Payer: Self-pay | Admitting: Podiatry

## 2021-07-12 ENCOUNTER — Ambulatory Visit (INDEPENDENT_AMBULATORY_CARE_PROVIDER_SITE_OTHER): Payer: 59 | Admitting: Podiatry

## 2021-07-12 ENCOUNTER — Other Ambulatory Visit: Payer: Self-pay

## 2021-07-12 DIAGNOSIS — M21622 Bunionette of left foot: Secondary | ICD-10-CM

## 2021-07-17 ENCOUNTER — Telehealth: Payer: Self-pay | Admitting: Podiatry

## 2021-07-17 NOTE — Telephone Encounter (Signed)
Patient came in today stating his work is asking for documentation why he can not wear his steel toe shoes. Patient needs it stated in a note from Dr Amalia Hailey.

## 2021-07-17 NOTE — Telephone Encounter (Signed)
That is fine.  Please provide a statement based on his last office visit and his diagnosis.  Thanks, Dr. Logan Bores

## 2021-07-19 ENCOUNTER — Encounter: Payer: Self-pay | Admitting: Podiatry

## 2021-07-27 NOTE — Progress Notes (Signed)
° °  HPI: 51 y.o. male presenting today for follow-up evaluation of pain and tenderness associated to the left lateral forefoot.  Patient states that not wearing steel toed boots does help significantly reduce his inflammatory pain to the tailor's bunion area.  He presents for follow-up treatment and evaluation.  No new complaints at this time  Past Medical History:  Diagnosis Date   Vertigo    1 episode 08/2014   Past Surgical History:  Procedure Laterality Date   ACHILLES TENDON SURGERY Right 01/11/2015   Procedure: ACHILLES TENDON REPAIR;  Surgeon: Recardo Evangelist, DPM;  Location: [facility];  Service: Podiatry;  Laterality: Right;  LMA WITH POPLITEAL BLOCK   CALCANEAL OSTEOTOMY Right 01/11/2015   Procedure: CALCANEAL OSTEctomy;  Surgeon: Recardo Evangelist, DPM;  Location: [facility];  Service: Podiatry;  Laterality: Right;   HAND SURGERY Left 03/15/03   TESTICLE SURGERY  07/14/06   No Known Allergies  Physical Exam: General: The patient is alert and oriented x3 in no acute distress.  Dermatology: Skin is warm, dry and supple bilateral lower extremities. Negative for open lesions or macerations.  Vascular: Palpable pedal pulses bilaterally. No edema or erythema noted. Capillary refill within normal limits.  Neurological: Epicritic and protective threshold grossly intact bilaterally.   Musculoskeletal Exam: Range of motion within normal limits to all pedal and ankle joints bilateral. Muscle strength 5/5 in all groups bilateral.  Clinical evidence of a tailor's bunionette deformity noted with a prominent fifth metatarsal head to the left foot and there continues to be some slight tenderness to palpation  Radiographic Exam taken 05/14/2021 LT foot:  Normal osseous mineralization. Joint spaces preserved. No fracture/dislocation/boney destruction.  There is an increased intermetatarsal angle between the fourth and fifth metatarsals with lateral deviation of the metatarsal head  noted  Assessment: 1.  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 Patient evaluated. X-Rays reviewed.  2.  Today and note was provided to refrain from steel toed or composite safety boots through 10/12/2021.  Patient feels that refraining from the steel toed boots for an additional few months will help significantly reduce his inflammatory pain to the bunion area 3.  Return to clinic as needed     Felecia Shelling, DPM [facility]  Dr. Felecia Shelling, DPM    [address] [place], [place] [address]                Office [phone]  Fax [phone]

## 2023-01-27 ENCOUNTER — Other Ambulatory Visit: Payer: Self-pay | Admitting: Student

## 2023-01-27 ENCOUNTER — Ambulatory Visit
Admission: RE | Admit: 2023-01-27 | Discharge: 2023-01-27 | Disposition: A | Discharge status: Home / Self Care | Payer: 59 | Source: Ambulatory Visit | Attending: Student | Admitting: Student

## 2023-01-27 DIAGNOSIS — M7989 Other specified soft tissue disorders: Secondary | ICD-10-CM | POA: Insufficient documentation

## 2023-01-27 DIAGNOSIS — M79605 Pain in left leg: Secondary | ICD-10-CM | POA: Diagnosis not present

## 2023-02-04 ENCOUNTER — Ambulatory Visit (INDEPENDENT_AMBULATORY_CARE_PROVIDER_SITE_OTHER): Payer: 59

## 2023-02-04 ENCOUNTER — Ambulatory Visit: Payer: 59 | Admitting: Podiatry

## 2023-02-04 ENCOUNTER — Encounter: Payer: Self-pay | Admitting: Podiatry

## 2023-02-04 VITALS — BP 111/72 | HR 59

## 2023-02-04 DIAGNOSIS — M76822 Posterior tibial tendinitis, left leg: Secondary | ICD-10-CM | POA: Diagnosis not present

## 2023-02-04 DIAGNOSIS — M779 Enthesopathy, unspecified: Secondary | ICD-10-CM

## 2023-02-04 NOTE — Patient Instructions (Signed)
Posterior Tibial Tendinitis  Posterior tibial tendinitis is irritation of a tendon called the posterior tibial tendon. Your posterior tibial tendon is a cord-like tissue that connects bones of your lower leg and foot to a muscle that: Supports your arch. Helps you raise up on your toes. Helps you turn your foot down and in. This condition causes foot and ankle pain. It can also lead to a flat foot. What are the causes? This condition is most often caused by repeated stress to the tendon (overuse injury). It can also be caused by a sudden injury that stresses the tendon, such as landing on your foot after jumping or falling. What increases the risk? This condition is more likely to develop in: People who play a sport that involves putting a lot of pressure on the feet, such as: Basketball. Tennis. Soccer. Hockey. Runners. Females who are older than 54 years of age and are overweight. People with diabetes. People with decreased foot stability. People with flat feet. What are the signs or symptoms? Symptoms include: Pain in the inner ankle. Pain at the arch of your foot. Pain that gets worse with running, walking, or standing. Swelling on the inside of your ankle and foot. Weakness in your ankle or foot. Inability to stand up on tiptoe. Flattening of the arch of your foot. How is this diagnosed? This condition may be diagnosed based on: Your symptoms. Your medical history. A physical exam. Tests, such as: X-ray. MRI. Ultrasound. How is this treated? This condition may be treated by: Putting ice to the injured area. Taking NSAIDs, such as ibuprofen, to reduce pain and swelling. Wearing a special shoe or shoe insert to support your arch (orthotic). Having physical therapy. Replacing high-impact exercise with low-impact exercise, such as swimming or cycling. If your symptoms do not improve with these treatments, you may need to wear a splint, removable walking boot, or short  leg cast for 6-8 weeks to keep your foot and ankle still (immobilized). Follow these instructions at home: If you have a cast, splint, or boot: Keep it clean and dry. Check the skin around it every day. Tell your health care provider about any concerns. If you have a cast: Do not stick anything inside it to scratch your skin. Doing that increases your risk of infection. You may put lotion on dry skin around the edges of the cast. Do not put lotion on the skin underneath the cast. If you have a splint or boot: Wear it as told by your health care provider. Remove it only as told by your health care provider. Loosen it if your toes tingle, become numb, or turn cold and blue. Bathing Do not take baths, swim, or use a hot tub until your health care provider approves. Ask your health care provider if you may take showers. If your cast, splint, or boot is not waterproof: Do not let it get wet. Cover it with a waterproof covering while you take a bath or a shower. Managing pain and swelling   If directed, put ice on the injured area. If you have a removable splint or boot, remove it as told by your health care provider. Put ice in a plastic bag. Place a towel between your skin and the bag or between your cast and the bag. Leave the ice on for 20 minutes, 2-3 times a day. Move your toes often to reduce stiffness and swelling. Raise (elevate) the injured area above the level of your heart while you are sitting   or lying down. Activity Do not use the injured foot to support your body weight until your health care provider says that you can. Use crutches as told by your health care provider. Do not do activities that make pain or swelling worse. Ask your health care provider when it is safe to drive if you have a cast, splint, or boot on your foot. Return to your normal activities as told by your health care provider. Ask your health care provider what activities are safe for you. Do exercises as  told by your health care provider. General instructions Take over-the-counter and prescription medicines only as told by your health care provider. If you have an orthotic, use it as told by your health care provider. Keep all follow-up visits as told by your health care provider. This is important. How is this prevented? Wear footwear that is appropriate to your athletic activity. Avoid athletic activities that cause pain or swelling in your ankle or foot. Before being active, do range-of-motion and stretching exercises. If you develop pain or swelling while training, stop training. If you have pain or swelling that does not improve after a few days of rest, see your health care provider. If you start a new athletic activity, start gradually so you can build up your strength and flexibility. Contact a health care provider if: Your symptoms get worse. Your symptoms do not improve in 6-8 weeks. You develop new, unexplained symptoms. Your splint, boot, or cast gets damaged. Summary Posterior tibial tendinitis is irritation of a tendon called the posterior tibial tendon. This condition is most often caused by repeated stress to the tendon (overuse injury). This condition causes foot pain and ankle pain. It can also lead to a flat foot. This condition may be treated by not doing high-impact activities, applying ice, having physical therapy, wearing orthotics, and wearing a cast, splint, or boot if needed. This information is not intended to replace advice given to you by your health care provider. Make sure you discuss any questions you have with your health care provider. Document Revised: 10/26/2018 Document Reviewed: 09/02/2018 Elsevier Patient Education  2020 [employer]  Posterior Tibial Tendinitis Rehab Ask your health care provider which exercises are safe for you. Do exercises exactly as told by your health care provider and adjust them as directed. It is normal to feel mild  stretching, pulling, tightness, or discomfort as you do these exercises. Stop right away if you feel sudden pain or your pain gets worse. Do not begin these exercises until told by your health care provider. Stretching and range-of-motion exercises These exercises warm up your muscles and joints and improve the movement and flexibility in your ankle and foot. These exercises may also help to relieve pain. Standing wall calf stretch, knee straight   Stand with your hands against a wall. Extend your left / right leg behind you, and bend your front knee slightly. If directed, place a folded washcloth under the arch of your foot for support. Point the toes of your back foot slightly inward. Keeping your heels on the floor and your back knee straight, shift your weight toward the wall. Do not allow your back to arch. You should feel a gentle stretch in your upper left / right calf. Hold this position for 10 seconds. Repeat 10 times. Complete this exercise 2 times a day. Standing wall calf stretch, knee bent Stand with your hands against a wall. Extend your left / right leg behind you, and bend your front   knee slightly. If directed, place a folded washcloth under the arch of your foot for support. Point the toes of your back foot slightly inward. Unlock your back knee so it is bent. Keep your heels on the floor. You should feel a gentle stretch deep in your lower left / right calf. Hold this position for 10 seconds. Repeat 10 times. Complete this exercise 2 times a day. Strengthening exercises These exercises build strength and endurance in your ankle and foot. Endurance is the ability to use your muscles for a long time, even after they get tired. Ankle inversion with band Secure one end of a rubber exercise band or tubing to a fixed object, such as a table leg or a pole, that will stay still when the band is pulled. Loop the other end of the band around the middle of your left / right foot. Sit  on the floor facing the object with your left / right leg extended. The band or tube should be slightly tense when your foot is relaxed. Leading with your big toe, slowly bring your left / right foot and ankle inward, toward your other foot (inversion). Hold this position for 10 seconds. Slowly return your foot to the starting position. Repeat 10 times. Complete this exercise 2 times a day. Towel curls   Sit in a chair on a non-carpeted surface, and put your feet on the floor. Place a towel in front of your feet. Keeping your heel on the floor, put your left / right foot on the towel. Pull the towel toward you by grabbing the towel with your toes and curling them under. Keep your heel on the floor while you do this. Let your toes relax. Grab the towel with your toes again. Keep going until the towel is completely underneath your foot. Repeat 10 times. Complete this exercise 2 times a day. Balance exercise This exercise improves or maintains your balance. Balance is important in preventing falls. Single leg stand Without wearing shoes, stand near a railing or in a doorway. You may hold on to the railing or door frame as needed for balance. Stand on your left / right foot. Keep your big toe down on the floor and try to keep your arch lifted. If balancing in this position is too easy, try the exercise with your eyes closed or while standing on a pillow. Hold this position for 10 seconds. Repeat 10 times. Complete this exercise 2 times a day. This information is not intended to replace advice given to you by your health care provider. Make sure you discuss any questions you have with your health care provider.  

## 2023-02-04 NOTE — Progress Notes (Signed)
  Subjective:  Patient ID: Brian Baird, male    DOB: [birth year],  MRN: [id number]  Chief Complaint  Patient presents with   Foot Pain    "My shin was hurting and it was swelling." N - ankle pain L - medial left D - 1.5 weeks O - suddenly, better C - touch, swelling, tender, sore A - none T - went to doctor - ultrasound, Ibuprofen, Tylenol, iced     51 y.o. male presents with the above complaint. History confirmed with patient.  Feels better now he worked overtime the week that it started  Objective:  Physical Exam: warm, good capillary refill, no trophic changes or ulcerative lesions, normal DP and PT pulses, normal sensory exam, and good range of motion no pain to palpation or with resisted inversion eversion.   Radiographs: Multiple views x-ray of left ankle: no fracture, dislocation, swelling or degenerative changes noted Assessment:   1. Posterior tibial tendinitis of left lower extremity      Plan:  Patient was evaluated and treated and all questions answered.  Discussed likely had a posterior tibial tendinitis rating up the medial side of the calf.  Discussed daily stretching exercise if it worsens.  Take ibuprofen as needed.  Likely an overuse issue from working overtime long hours, seems to resolve at this point  No follow-ups on file.

## 2023-12-02 ENCOUNTER — Ambulatory Visit
Admission: RE | Admit: 2023-12-02 | Discharge: 2023-12-02 | Disposition: A | Discharge status: Home / Self Care | Attending: Surgery | Admitting: Surgery

## 2023-12-02 ENCOUNTER — Encounter
Admission: RE | Disposition: A | Discharge status: Home / Self Care | Payer: Self-pay | Source: Home / Self Care | Attending: Surgery

## 2023-12-02 ENCOUNTER — Ambulatory Visit: Admitting: Anesthesiology

## 2023-12-02 DIAGNOSIS — Z1211 Encounter for screening for malignant neoplasm of colon: Secondary | ICD-10-CM | POA: Insufficient documentation

## 2023-12-02 DIAGNOSIS — R42 Dizziness and giddiness: Secondary | ICD-10-CM | POA: Diagnosis not present

## 2023-12-02 DIAGNOSIS — M545 Low back pain, unspecified: Secondary | ICD-10-CM | POA: Diagnosis not present

## 2023-12-02 DIAGNOSIS — Z87891 Personal history of nicotine dependence: Secondary | ICD-10-CM | POA: Diagnosis not present

## 2023-12-02 DIAGNOSIS — K640 First degree hemorrhoids: Secondary | ICD-10-CM | POA: Diagnosis not present

## 2023-12-02 DIAGNOSIS — G8929 Other chronic pain: Secondary | ICD-10-CM | POA: Diagnosis not present

## 2023-12-02 DIAGNOSIS — Z83719 Family history of colon polyps, unspecified: Secondary | ICD-10-CM | POA: Insufficient documentation

## 2023-12-02 HISTORY — PX: COLONOSCOPY: SHX5424

## 2023-12-02 SURGERY — COLONOSCOPY
Anesthesia: General | Site: Abdomen

## 2023-12-02 MED ORDER — PROPOFOL 500 MG/50ML IV EMUL
INTRAVENOUS | Status: DC | PRN
  Administered 2023-12-02: 100 ug/kg/min via INTRAVENOUS

## 2023-12-02 MED ORDER — LIDOCAINE HCL (CARDIAC) PF 100 MG/5ML IV SOSY
PREFILLED_SYRINGE | INTRAVENOUS | Status: DC | PRN
  Administered 2023-12-02: 60 mg via INTRAVENOUS

## 2023-12-02 MED ORDER — PROPOFOL 10 MG/ML IV BOLUS
INTRAVENOUS | Status: DC | PRN
  Administered 2023-12-02: 80 mg via INTRAVENOUS
  Administered 2023-12-02: 50 mg via INTRAVENOUS

## 2023-12-02 MED ORDER — LIDOCAINE HCL (PF) 2 % IJ SOLN
INTRAMUSCULAR | Status: AC
  Filled 2023-12-02: qty 5

## 2023-12-02 MED ORDER — SODIUM CHLORIDE 0.9 % IV SOLN
INTRAVENOUS | Status: DC
  Administered 2023-12-02: 20 mL/h via INTRAVENOUS

## 2023-12-02 MED ORDER — PROPOFOL 1000 MG/100ML IV EMUL
INTRAVENOUS | Status: AC
  Filled 2023-12-02: qty 100

## 2023-12-02 NOTE — Transfer of Care (Signed)
 Immediate Anesthesia Transfer of Care Note  Patient: Brian Baird  Procedure(s) Performed: COLONOSCOPY (Abdomen)  Patient Location: PACU  Anesthesia Type:MAC  Level of Consciousness: drowsy  Airway & Oxygen Therapy: Patient Spontanous Breathing  Post-op Assessment: Report given to RN and Post -op Vital signs reviewed and stable  Post vital signs: Reviewed and stable  Last Vitals:  Vitals Value Taken Time  BP 92/55 12/02/23 0754  Temp 35.8 C 12/02/23 0754  Pulse 54 12/02/23 0756  Resp 17 12/02/23 0756  SpO2 99 % 12/02/23 0756  Vitals shown include unfiled device data.  Last Pain:  Vitals:   12/02/23 0754  TempSrc: Temporal  PainSc: Asleep         Complications: No notable events documented.

## 2023-12-02 NOTE — Anesthesia Postprocedure Evaluation (Signed)
 Anesthesia Post Note  Patient: Brian Baird  Procedure(s) Performed: COLONOSCOPY (Abdomen)  Patient location during evaluation: PACU Anesthesia Type: General Level of consciousness: awake and alert, oriented and patient cooperative Pain management: pain level controlled Vital Signs Assessment: post-procedure vital signs reviewed and stable Respiratory status: spontaneous breathing, nonlabored ventilation and respiratory function stable Cardiovascular status: blood pressure returned to baseline and stable Postop Assessment: adequate PO intake Anesthetic complications: no   No notable events documented.   Last Vitals:  Vitals:   12/02/23 0804 12/02/23 0814  BP: 91/62 112/78  Pulse: (!) 55   Resp: 16 15  Temp:    SpO2: 99% 100%    Last Pain:  Vitals:   12/02/23 0804  TempSrc:   PainSc: 0-No pain                 Dorothey Gate

## 2023-12-02 NOTE — H&P (Signed)
 Subjective:   CC: Family history of colonic polyps [Z83.719]  HPI: Brian Baird. is a 53 y.o. male who presents for evaluation of above. Family hx of colon polyps, last colonoscopy 5 years ago with no concern. No new issues.  Past Medical History: has a past medical history of Vertigo.  Past Surgical History: has a past surgical history that includes hand surgery (16109604); Surgery scrotal / testicular ([id number]); Achilles tendon repair (Right, 01/11/2015); Colonoscopy (02/28/2019); and Lower leg soft tissue tumor excision (Right, 06/2019).  Family History: family history includes Diabetes type I in his maternal grandfather and maternal grandmother; High blood pressure (Hypertension) in his maternal grandmother; Prostate cancer (age of onset: 76) in his father.  Social History: reports that he has quit smoking. He has been exposed to tobacco smoke. He has never used smokeless tobacco. He reports current alcohol use. He reports that he does not use drugs.  Current Medications: has a current medication list which includes the following prescription(s): meloxicam.  Allergies:  No Known Allergies  ROS:  A 15 point review of systems was performed and pertinent positives and negatives noted in HPI  Objective:    BP 103/67  Pulse 57  Ht 170.2 cm (5\' 7" )  Wt 77.1 kg (170 lb)  BMI 26.63 kg/m   Constitutional : No distress, cooperative, alert  Lymphatics/Throat: Supple with no lymphadenopathy  Respiratory: Clear to auscultation bilaterally  Cardiovascular: Regular rate and rhythm  Gastrointestinal: Soft, non-tender, non-distended, no organomegaly.  Musculoskeletal: Steady gait and movement  Skin: Cool and moist  Psychiatric: Normal affect, non-agitated, not confused     LABS:  N/a  RADS: N/a  Assessment:    Family history of colonic polyps [Z83.719]  Plan:    1. Family history of colonic polyps [Z83.719] R/b/a discussed. Risks include bleeding, perforation.  Benefits include diagnostic, curative procedure if needed. Alternatives include continued observation.   Pt verbalized understanding. Procedure will be scheduled.  labs/images/medications/previous chart entries reviewed personally and relevant changes/updates noted above.

## 2023-12-02 NOTE — Interval H&P Note (Signed)
 History and Physical Interval Note:  12/02/2023 7:09 AM  Brian Baird  has presented today for surgery, with the diagnosis of Z39.719  Family Hx colonic polyps.  The various methods of treatment have been discussed with the patient and family. After consideration of risks, benefits and other options for treatment, the patient has consented to  Procedure(s): COLONOSCOPY (N/A) as a surgical intervention.  The patient's history has been reviewed, patient examined, no change in status, stable for surgery.  I have reviewed the patient's chart and labs.  Questions were answered to the patient's satisfaction.     [name]

## 2023-12-02 NOTE — Anesthesia Preprocedure Evaluation (Addendum)
 Anesthesia Evaluation  Patient identified by MRN, date of birth, ID band Patient awake    Reviewed: Allergy & Precautions, NPO status , Patient's Chart, lab work & pertinent test results  History of Anesthesia Complications Negative for: history of anesthetic complications  Airway Mallampati: II   Neck ROM: Full    Dental  (+) Missing, Chipped   Pulmonary former smoker (quit 12 years ago)   Pulmonary exam normal breath sounds clear to auscultation       Cardiovascular Exercise Tolerance: Good negative cardio ROS Normal cardiovascular exam Rhythm:Regular Rate:Normal     Neuro/Psych Vertigo; chronic lower back pain; marijuana use every other day    GI/Hepatic negative GI ROS,,,  Endo/Other  negative endocrine ROS    Renal/GU negative Renal ROS     Musculoskeletal   Abdominal   Peds  Hematology negative hematology ROS (+)   Anesthesia Other Findings   Reproductive/Obstetrics                             Anesthesia Physical Anesthesia Plan  ASA: 2  Anesthesia Plan: General   Post-op Pain Management:    Induction: Intravenous  PONV Risk Score and Plan: 1 and Propofol  infusion, TIVA and Treatment may vary due to age or medical condition  Airway Management Planned: Natural Airway  Additional Equipment:   Intra-op Plan:   Post-operative Plan:   Informed Consent: I have reviewed the patients History and Physical, chart, labs and discussed the procedure including the risks, benefits and alternatives for the proposed anesthesia with the patient or authorized representative who has indicated his/her understanding and acceptance.       Plan Discussed with: CRNA  Anesthesia Plan Comments: (LMA/GETA backup discussed.  Patient consented for risks of anesthesia including but not limited to:  - adverse reactions to medications - damage to eyes, teeth, lips or other oral mucosa -  nerve damage due to positioning  - sore throat or hoarseness - damage to heart, brain, nerves, lungs, other parts of body or loss of life  Informed patient about role of CRNA in peri- and intra-operative care.  Patient voiced understanding.)        Anesthesia Quick Evaluation

## 2023-12-02 NOTE — Op Note (Addendum)
 [facility] Patient Name: Brian Baird Procedure Date: 12/02/2023 7:23 AM MRN: [id number] Account #: [id number] Date of Birth: 06/04/1971 Admit Type: Outpatient Age: 53 Room: [facility] ENDO ROOM 4 Gender: Male Note Status: Finalized Instrument Name: Charlyn Cooley [id number] Procedure:             Colonoscopy Indications:           Colon cancer screening in patient at increased risk:                         Family history of 1st-degree relative with colon polyps Providers:             [name] MD, MD Referring MD:          [name], MD (Referring MD) Medicines:             Propofol  per Anesthesia Complications:         No immediate complications. Procedure:             Pre-Anesthesia Assessment:                        - After reviewing the risks and benefits, the patient                         was deemed in satisfactory condition to undergo the                         procedure in an ambulatory setting.                        After obtaining informed consent, the colonoscope was                         passed under direct vision. Throughout the procedure,                         the patient's blood pressure, pulse, and oxygen                         saturations were monitored continuously. The                         Colonoscope was introduced through the anus and                         advanced to the the cecum, identified by the ileocecal                         valve. The colonoscopy was performed without                         difficulty. The patient tolerated the procedure well.                         The quality of the bowel preparation was good. Findings:      The perianal and digital rectal examinations were normal.      Non-bleeding internal hemorrhoids were found during retroflexion. The       hemorrhoids were Grade I (internal hemorrhoids that do not prolapse).  The exam was otherwise without abnormality. Impression:             - Non-bleeding internal hemorrhoids.                        - The examination was otherwise normal.                        - No specimens collected. Recommendation:        - Discharge patient to home.                        - Resume previous diet.                        - Written discharge instructions were provided to the                         patient.                        - Repeat colonoscopy in 5-10 years for screening                         purposes. Procedure Code(s):     --- Professional ---                        [id number], Colonoscopy, flexible; diagnostic, including                         collection of specimen(s) by brushing or washing, when                         performed (separate procedure) Diagnosis Code(s):     --- Professional ---                        Z80.0, Family history of malignant neoplasm of                         digestive organs                        K64.0, First degree hemorrhoids CPT copyright 2022 American [employer]. All rights reserved. The codes documented in this report are preliminary and upon coder review may  be revised to meet current compliance requirements. Dr. Ward Guy, MD [name] MD, MD 12/02/2023 7:52:44 AM This report has been signed electronically. Number of Addenda: 0 Note Initiated On: 12/02/2023 7:23 AM Scope Withdrawal Time: 0 hours 6 minutes 28 seconds  Total Procedure Duration: 0 hours 15 minutes 59 seconds  Estimated Blood Loss:  Estimated blood loss: none.      [facility]

## 2023-12-03 ENCOUNTER — Encounter: Payer: Self-pay | Admitting: Surgery

## 2024-01-16 ENCOUNTER — Emergency Department
Admission: EM | Admit: 2024-01-16 | Discharge: 2024-01-16 | Disposition: A | Discharge status: Home / Self Care | Attending: Emergency Medicine | Admitting: Emergency Medicine

## 2024-01-16 ENCOUNTER — Other Ambulatory Visit: Payer: Self-pay

## 2024-01-16 ENCOUNTER — Emergency Department

## 2024-01-16 DIAGNOSIS — S86002A Unspecified injury of left Achilles tendon, initial encounter: Secondary | ICD-10-CM | POA: Diagnosis not present

## 2024-01-16 DIAGNOSIS — S99912A Unspecified injury of left ankle, initial encounter: Secondary | ICD-10-CM | POA: Diagnosis present

## 2024-01-16 DIAGNOSIS — X501XXA Overexertion from prolonged static or awkward postures, initial encounter: Secondary | ICD-10-CM | POA: Diagnosis not present

## 2024-01-16 DIAGNOSIS — M25572 Pain in left ankle and joints of left foot: Secondary | ICD-10-CM

## 2024-01-16 MED ORDER — IBUPROFEN 600 MG PO TABS: 600.0000 mg | ORAL_TABLET | Freq: Three times a day (TID) | ORAL | 0 refills | Status: AC | PRN

## 2024-01-16 MED ORDER — IBUPROFEN 800 MG PO TABS
800.0000 mg | ORAL_TABLET | Freq: Once | ORAL | Status: AC
  Administered 2024-01-16: 800 mg via ORAL
  Filled 2024-01-16: qty 1

## 2024-01-16 MED ORDER — TRAMADOL HCL 50 MG PO TABS: 50.0000 mg | ORAL_TABLET | Freq: Four times a day (QID) | ORAL | 0 refills | Status: DC | PRN

## 2024-01-16 NOTE — ED Triage Notes (Signed)
 Pt to ED for lower left leg injury at tire place. Was trying to reach down to prevent a tire from rolling into someone's car and felt something 'pop' to achilles tendon area. Hx achilles tendon injury to other ankle with surgical repair. Is able to plantar flex and dorsiflex foot. Painful to bear weight.

## 2024-01-16 NOTE — ED Notes (Signed)
 Applied aircast and fitted crutches

## 2024-01-16 NOTE — Discharge Instructions (Addendum)
 You have been diagnosed with acute left ankle pain, injury of Achilles tendon.  Please take ibuprofen  1 tablet by mouth every 8 hours with meals as needed for pain.  You can take tramadol  1 tablet by mouth every 6 hours as needed for pain.  Please call Dr. Sikora and make an appointment for a follow-up.  Please come back to ED or go to your PCP you have new symptoms or symptoms worsen.
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
## 2024-01-18 ENCOUNTER — Encounter: Payer: Self-pay | Admitting: Podiatry

## 2024-01-18 ENCOUNTER — Ambulatory Visit: Admitting: Podiatry

## 2024-01-18 VITALS — Ht 66.0 in | Wt 165.0 lb

## 2024-01-18 DIAGNOSIS — S86012A Strain of left Achilles tendon, initial encounter: Secondary | ICD-10-CM

## 2024-01-18 DIAGNOSIS — S86011A Strain of right Achilles tendon, initial encounter: Secondary | ICD-10-CM | POA: Diagnosis not present
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
## 2024-01-19 ENCOUNTER — Telehealth: Payer: Self-pay | Admitting: Podiatry

## 2024-01-19 NOTE — Telephone Encounter (Signed)
 Called pt to let him know his surgery is scheduled for 7/14 and [facility] will call him with the time of his surgery.  He is not taking any blood thinners or GLP1 medications.  Pharmacy correct in chart.

## 2024-01-19 NOTE — Telephone Encounter (Signed)
 PER AARON D AT [employer] INSURANCE NO PRE CERT IS REQUIRED FOR CPT [id number].SABRA   DOS 01/25/24  REF # [id number]

## 2024-01-25 ENCOUNTER — Other Ambulatory Visit: Payer: Self-pay | Admitting: Podiatry

## 2024-01-25 ENCOUNTER — Telehealth: Payer: Self-pay

## 2024-01-25 DIAGNOSIS — S86092D Other specified injury of left Achilles tendon, subsequent encounter: Secondary | ICD-10-CM | POA: Diagnosis not present

## 2024-01-25 MED ORDER — GABAPENTIN 300 MG PO CAPS: 300.0000 mg | ORAL_CAPSULE | Freq: Three times a day (TID) | ORAL | 0 refills | Status: AC

## 2024-01-25 MED ORDER — RIVAROXABAN 10 MG PO TABS
10.0000 mg | ORAL_TABLET | Freq: Every day | ORAL | 0 refills | Status: AC
Start: 2024-01-25 — End: 2024-02-24

## 2024-01-25 MED ORDER — OXYCODONE HCL 5 MG PO TABS: 5.0000 mg | ORAL_TABLET | ORAL | 0 refills | Status: AC | PRN

## 2024-01-25 MED ORDER — ACETAMINOPHEN 500 MG PO TABS: 1000.0000 mg | ORAL_TABLET | Freq: Four times a day (QID) | ORAL | 0 refills | Status: AC | PRN

## 2024-01-25 NOTE — Telephone Encounter (Signed)
 PA approved 01/25/24-02/25/24

## 2024-01-25 NOTE — Telephone Encounter (Signed)
 PA request received from [employer] for Oxycodone  HCl 5mg  tablet. PA submitted and waiting on response.  PRATEEK KNIPPLE  (Key: [id number]) Rx #: [phone]

## 2024-01-25 NOTE — Telephone Encounter (Signed)
 Pt called regarding PA for medication and was given notes from chart.

## 2024-01-27 ENCOUNTER — Telehealth: Payer: Self-pay

## 2024-01-27 ENCOUNTER — Telehealth: Payer: Self-pay | Admitting: Podiatry

## 2024-01-27 ENCOUNTER — Emergency Department
Admission: EM | Admit: 2024-01-27 | Discharge: 2024-01-27 | Discharge status: Against Medical Advice | Attending: Emergency Medicine | Admitting: Emergency Medicine

## 2024-01-27 ENCOUNTER — Encounter: Payer: Self-pay | Admitting: Emergency Medicine

## 2024-01-27 ENCOUNTER — Other Ambulatory Visit: Payer: Self-pay

## 2024-01-27 DIAGNOSIS — T819XXA Unspecified complication of procedure, initial encounter: Secondary | ICD-10-CM | POA: Insufficient documentation

## 2024-01-27 DIAGNOSIS — M792 Neuralgia and neuritis, unspecified: Secondary | ICD-10-CM | POA: Insufficient documentation

## 2024-01-27 DIAGNOSIS — Z5321 Procedure and treatment not carried out due to patient leaving prior to being seen by health care provider: Secondary | ICD-10-CM | POA: Diagnosis not present

## 2024-01-27 DIAGNOSIS — X58XXXA Exposure to other specified factors, initial encounter: Secondary | ICD-10-CM | POA: Diagnosis not present

## 2024-01-27 DIAGNOSIS — S86009A Unspecified injury of unspecified Achilles tendon, initial encounter: Secondary | ICD-10-CM | POA: Insufficient documentation

## 2024-01-27 NOTE — Telephone Encounter (Signed)
 Patient called concerned about a possible blood clot in his thigh. He states that there is a bruise about an inch wide at the top part of his thigh. He also stated there is no pain,but some soreness when he touches it. He also feels a knot in the area. Please give patient a call as to what to do.

## 2024-01-27 NOTE — ED Triage Notes (Signed)
 Patient to ED via POV for post-op problem. Pt had achilles tendon repair. Surgery was on Monday. Now has a knot on the upper thigh with bruising since surgery. States he has had some nerve pain since. Currently taking xarelto  since surgery.

## 2024-01-27 NOTE — Telephone Encounter (Signed)
 Patient called stating he had surgery on Monday. Patient states his discharge paperwork states he can take Ibruprophen 800mg  but Dr Silva told him not to take it. Patient was verifying.

## 2024-01-28 NOTE — Telephone Encounter (Signed)
 Bruise is from the nerve block I discussed with him. No thigh or calf swelling, redness, or severe pain. He will let us  know if it worsens or he develops new symptoms

## 2024-02-03 ENCOUNTER — Ambulatory Visit: Admitting: Podiatry

## 2024-02-03 DIAGNOSIS — S86012A Strain of left Achilles tendon, initial encounter: Secondary | ICD-10-CM

## 2024-02-05 NOTE — Progress Notes (Signed)
  Subjective:  Patient ID: Brian Baird, male    DOB: November 09, 1970,  MRN: [id number]  Chief Complaint  Patient presents with   Post-op Follow-up    Rm 1 POV # 1 DOS 01/25/24 LT ACHILLES TENDON REPAIR      53 y.o. male returns for post-op check.  Pain relatively controlled  Review of Systems: Negative except as noted in the HPI. Denies N/V/F/Ch.   Objective:  There were no vitals filed for this visit. There is no height or weight on file to calculate BMI. Constitutional Well developed. Well nourished.  Vascular Foot warm and well perfused. Capillary refill normal to all digits.  Calf is soft and supple, no posterior calf or knee pain, negative Homans' sign  Neurologic Normal speech. Oriented to person, place, and time. Epicritic sensation to light touch grossly present bilaterally.  Dermatologic Skin healing well without signs of infection. Skin edges well coapted without signs of infection.  Orthopedic: Mild pain to palpation noted about the surgical site.    Assessment:   1. Achilles rupture, left, initial encounter    Plan:  Patient was evaluated and treated and all questions answered.  S/p foot surgery right -Progressing as expected post-operatively. -XR: none -WB Status: NWB in posterior splint, reapplied today -Sutures: remove in 1 week. -Medications: continue xarelto  -Foot redressed. Posterior below knee well padded splin applied. Transition to boot with wedges next visit   Return in about 1 week (around 02/10/2024) for suture removal, post op (no x-rays).

## 2024-02-10 ENCOUNTER — Ambulatory Visit (INDEPENDENT_AMBULATORY_CARE_PROVIDER_SITE_OTHER): Admitting: Podiatry

## 2024-02-10 VITALS — Ht 66.0 in | Wt 170.0 lb

## 2024-02-10 DIAGNOSIS — S86012D Strain of left Achilles tendon, subsequent encounter: Secondary | ICD-10-CM

## 2024-02-10 NOTE — Patient Instructions (Addendum)
 Call to schedule physical therapy [facility] [address]  [place]  [address]  Main: [phone]    In 2 weeks you may begin to walk in the boot with the wedges using your crutches.  At that point begin removing 1 wedge per week as well.  Start PT at this point as well.  At this point you can also begin to take the boot off to begin range of motion of the foot.  You may bring the foot up to 90 degrees to your leg do not go past this point.  While your toes are pointed down you can move the foot left to right but not while they are up.  Do not walk or move around without the boot on

## 2024-02-11 NOTE — Progress Notes (Signed)
  Subjective:  Patient ID: Brian Baird, male    DOB: 1970-11-04,  MRN: [id number]  Chief Complaint  Patient presents with   Post-op Follow-up    Rm 1 POV # 2 DOS 01/25/24 LT ACHILLES TENDON REPAIR. Surgical site is healing well, sutures are intact with no swelling or redness.      53 y.o. male returns for post-op check.  Pain relatively controlled  Review of Systems: Negative except as noted in the HPI. Denies N/V/F/Ch.   Objective:  There were no vitals filed for this visit. Body mass index is 27.44 kg/m. Constitutional Well developed. Well nourished.  Vascular Foot warm and well perfused. Capillary refill normal to all digits.  Calf is soft and supple, no posterior calf or knee pain, negative Homans' sign  Neurologic Normal speech. Oriented to person, place, and time. Epicritic sensation to light touch grossly present bilaterally.  Dermatologic Incision well-healed not hypertrophic  Orthopedic: Edema improved, minimal pain pain to palpation noted about the surgical site.    Assessment:   1. Achilles rupture, left, subsequent encounter    Plan:  Patient was evaluated and treated and all questions answered.  S/p foot surgery right -Progressing as expected post-operatively. - Sutures removed.  Transition to cam walker boot with plantarflexion wedges.  Continue nonweightbearing and crutches for 2 more weeks.  At that point may begin weightbearing in boot and remove first wedge and then weekly thereafter.  Start PT at this point.  Referral has been placed.  Range of motion can begin to neutral but not beyond inversion eversion okay with plantarflexion.  Follow-up in 6 weeks and we will begin transitioning out of the boot and range of motion past neutral at that point  Return in about 6 weeks (around 03/23/2024) for post op (no x-rays).

## 2024-02-17 ENCOUNTER — Encounter: Admitting: Podiatry

## 2024-02-17 ENCOUNTER — Telehealth: Payer: Self-pay | Admitting: Lab

## 2024-02-17 NOTE — Telephone Encounter (Signed)
  Patient states having tingling and pain some burning sensation wants to know what to do. Surgery date was 01/25/2024 wants to know if these symptoms are normal.

## 2024-02-17 NOTE — Telephone Encounter (Signed)
 Spoke to patient advised symptoms are normal and will resolve with time patient states not in any significant pain at this time.

## 2024-02-17 NOTE — Telephone Encounter (Signed)
 Normal and should resolve w/ time. If it is very painful / limiting him from doing activity I can prescribe gabapentin 

## 2024-02-19 ENCOUNTER — Telehealth: Payer: Self-pay | Admitting: Podiatry

## 2024-02-19 NOTE — Telephone Encounter (Signed)
 Patient wants to inform you that [facility] that he was referred to isn't in network.Is there a different practice that you could recommend?  I suggested he call his insurance company to get a list of facilities that are in network.

## 2024-02-21 ENCOUNTER — Other Ambulatory Visit: Payer: Self-pay | Admitting: Podiatry

## 2024-02-22 NOTE — Telephone Encounter (Signed)
 Called patient to relay the message. He stated the insurance has changed to [facility] and they are assisting him with finding a therapist in network.

## 2024-02-24 ENCOUNTER — Ambulatory Visit: Payer: Self-pay | Attending: Podiatry

## 2024-02-24 DIAGNOSIS — S86012D Strain of left Achilles tendon, subsequent encounter: Secondary | ICD-10-CM | POA: Diagnosis not present

## 2024-02-24 DIAGNOSIS — R262 Difficulty in walking, not elsewhere classified: Secondary | ICD-10-CM | POA: Insufficient documentation

## 2024-02-24 DIAGNOSIS — M25572 Pain in left ankle and joints of left foot: Secondary | ICD-10-CM | POA: Diagnosis present
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
## 2024-02-29 ENCOUNTER — Ambulatory Visit: Payer: Self-pay

## 2024-02-29 DIAGNOSIS — R262 Difficulty in walking, not elsewhere classified: Secondary | ICD-10-CM

## 2024-02-29 DIAGNOSIS — M25572 Pain in left ankle and joints of left foot: Secondary | ICD-10-CM
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
## 2024-03-04 ENCOUNTER — Ambulatory Visit: Payer: Self-pay

## 2024-03-04 DIAGNOSIS — R262 Difficulty in walking, not elsewhere classified: Secondary | ICD-10-CM | POA: Diagnosis not present

## 2024-03-04 DIAGNOSIS — M25572 Pain in left ankle and joints of left foot: Secondary | ICD-10-CM
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
## 2024-03-08 ENCOUNTER — Ambulatory Visit

## 2024-03-08 DIAGNOSIS — M25572 Pain in left ankle and joints of left foot: Secondary | ICD-10-CM

## 2024-03-08 DIAGNOSIS — R262 Difficulty in walking, not elsewhere classified: Secondary | ICD-10-CM
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
## 2024-03-09 ENCOUNTER — Encounter: Admitting: Podiatry

## 2024-03-10 ENCOUNTER — Telehealth: Payer: Self-pay | Admitting: Lab

## 2024-03-10 NOTE — Telephone Encounter (Signed)
 Patient states has been going to physical therapy 4-5 sessions now and is experiencing knee pain that goes into calf area would like to know if this is normal.

## 2024-03-10 NOTE — Telephone Encounter (Signed)
 Spoke to patient states it is the same pain since he has his surgery has discussed with therapist and referred back to us . States it is sharp burning pain he states has discussed with you.

## 2024-03-10 NOTE — Telephone Encounter (Signed)
 Patient states that he is getting frustrated with it he has been experiencing this pain since his surgery it is not a new pain he states it is not the knee it is a sharp burning pain on the side of the knee into top of calf area .

## 2024-03-11 ENCOUNTER — Ambulatory Visit

## 2024-03-11 DIAGNOSIS — M25572 Pain in left ankle and joints of left foot: Secondary | ICD-10-CM

## 2024-03-11 DIAGNOSIS — R262 Difficulty in walking, not elsewhere classified: Secondary | ICD-10-CM
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
## 2024-03-15 ENCOUNTER — Ambulatory Visit: Attending: Podiatry

## 2024-03-15 DIAGNOSIS — M25572 Pain in left ankle and joints of left foot: Secondary | ICD-10-CM | POA: Diagnosis present

## 2024-03-15 DIAGNOSIS — R262 Difficulty in walking, not elsewhere classified: Secondary | ICD-10-CM | POA: Insufficient documentation
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
## 2024-03-17 ENCOUNTER — Ambulatory Visit

## 2024-03-17 DIAGNOSIS — M25572 Pain in left ankle and joints of left foot: Secondary | ICD-10-CM

## 2024-03-17 DIAGNOSIS — R262 Difficulty in walking, not elsewhere classified: Secondary | ICD-10-CM
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
## 2024-03-21 ENCOUNTER — Ambulatory Visit

## 2024-03-21 DIAGNOSIS — R262 Difficulty in walking, not elsewhere classified: Secondary | ICD-10-CM

## 2024-03-21 DIAGNOSIS — M25572 Pain in left ankle and joints of left foot: Secondary | ICD-10-CM
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
## 2024-03-23 ENCOUNTER — Encounter: Payer: Self-pay | Admitting: Podiatry

## 2024-03-23 ENCOUNTER — Ambulatory Visit

## 2024-03-23 ENCOUNTER — Telehealth: Payer: Self-pay | Admitting: Podiatry

## 2024-03-23 ENCOUNTER — Ambulatory Visit (INDEPENDENT_AMBULATORY_CARE_PROVIDER_SITE_OTHER): Admitting: Podiatry

## 2024-03-23 VITALS — Ht 66.0 in | Wt 170.0 lb

## 2024-03-23 DIAGNOSIS — R262 Difficulty in walking, not elsewhere classified: Secondary | ICD-10-CM | POA: Diagnosis not present

## 2024-03-23 DIAGNOSIS — M25572 Pain in left ankle and joints of left foot: Secondary | ICD-10-CM

## 2024-03-23 DIAGNOSIS — S86012D Strain of left Achilles tendon, subsequent encounter: Secondary | ICD-10-CM

## 2024-03-23 NOTE — Progress Notes (Signed)
  Subjective:  Patient ID: Brian Baird., male    DOB: Jan 23, 1971,  MRN: [id number]  Chief Complaint  Patient presents with   Post-op Follow-up    Rm 2 POV #3 DOS 01/25/24 LT ACHILLES TENDON REPAIR. Pt states no pain or discomfort.       53 y.o. male returns for post-op check.  Doing well but tenderness in the knee and calf is getting better  Review of Systems: Negative except as noted in the HPI. Denies N/V/F/Ch.   Objective:  There were no vitals filed for this visit. Body mass index is 27.44 kg/m. Constitutional Well developed. Well nourished.  Vascular Foot warm and well perfused. Capillary refill normal to all digits.  Calf is soft and supple, no posterior calf or knee pain, negative Homans' sign  Neurologic Normal speech. Oriented to person, place, and time. Epicritic sensation to light touch grossly present bilaterally.  Dermatologic Incision well-healed not hypertrophic  Orthopedic: He has no pain at the surgical site.  He has good strength in plantarflexion, limited range of motion in dorsiflexion    Assessment:   1. Achilles rupture, left, subsequent encounter    Plan:  Patient was evaluated and treated and all questions answered.  S/p foot surgery right - Doing well.  Begin transitioning out of CAM boot back to regular shoes with heel lifts over the next few weeks.  We discussed this will be a gradual process and that with the healing phase has largely completed he will need to focus on strengthening conditioning of the leg.  Okay to come out of boot for therapy as well and begin to gradually progress past neutral range of motion as he strengthens.  Follow-up with me in 1 month to reevaluate.  Hopefully should be able to begin think about return to work by the end of October  Return in about 1 month (around 04/22/2024) for f/u Achilles repair.

## 2024-03-23 NOTE — Telephone Encounter (Signed)
 Per note from Dr. Silva, pt is to be out of work until approx 05/16/24. I sent letter to pt via MyChart
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
## 2024-03-24 ENCOUNTER — Ambulatory Visit

## 2024-03-28 ENCOUNTER — Ambulatory Visit

## 2024-03-28 DIAGNOSIS — R262 Difficulty in walking, not elsewhere classified: Secondary | ICD-10-CM | POA: Diagnosis not present

## 2024-03-28 DIAGNOSIS — M25572 Pain in left ankle and joints of left foot: Secondary | ICD-10-CM
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
## 2024-04-01 ENCOUNTER — Ambulatory Visit

## 2024-04-01 DIAGNOSIS — R262 Difficulty in walking, not elsewhere classified: Secondary | ICD-10-CM | POA: Diagnosis not present

## 2024-04-01 DIAGNOSIS — M25572 Pain in left ankle and joints of left foot: Secondary | ICD-10-CM
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
## 2024-04-04 ENCOUNTER — Ambulatory Visit

## 2024-04-04 DIAGNOSIS — R262 Difficulty in walking, not elsewhere classified: Secondary | ICD-10-CM | POA: Diagnosis not present

## 2024-04-04 DIAGNOSIS — M25572 Pain in left ankle and joints of left foot: Secondary | ICD-10-CM
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
## 2024-04-08 ENCOUNTER — Ambulatory Visit

## 2024-04-08 DIAGNOSIS — R262 Difficulty in walking, not elsewhere classified: Secondary | ICD-10-CM | POA: Diagnosis not present

## 2024-04-08 DIAGNOSIS — M25572 Pain in left ankle and joints of left foot: Secondary | ICD-10-CM
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
## 2024-04-11 ENCOUNTER — Ambulatory Visit

## 2024-04-11 DIAGNOSIS — R262 Difficulty in walking, not elsewhere classified: Secondary | ICD-10-CM

## 2024-04-11 DIAGNOSIS — M25572 Pain in left ankle and joints of left foot: Secondary | ICD-10-CM
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
## 2024-04-14 ENCOUNTER — Ambulatory Visit: Attending: Podiatry

## 2024-04-14 DIAGNOSIS — M25572 Pain in left ankle and joints of left foot: Secondary | ICD-10-CM | POA: Insufficient documentation

## 2024-04-14 DIAGNOSIS — R262 Difficulty in walking, not elsewhere classified: Secondary | ICD-10-CM | POA: Diagnosis present
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
## 2024-04-19 ENCOUNTER — Ambulatory Visit

## 2024-04-19 DIAGNOSIS — M25572 Pain in left ankle and joints of left foot: Secondary | ICD-10-CM

## 2024-04-19 DIAGNOSIS — R262 Difficulty in walking, not elsewhere classified: Secondary | ICD-10-CM | POA: Diagnosis not present
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
## 2024-04-21 ENCOUNTER — Ambulatory Visit

## 2024-04-21 DIAGNOSIS — M25572 Pain in left ankle and joints of left foot: Secondary | ICD-10-CM

## 2024-04-21 DIAGNOSIS — R262 Difficulty in walking, not elsewhere classified: Secondary | ICD-10-CM | POA: Diagnosis not present
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
## 2024-04-26 ENCOUNTER — Ambulatory Visit

## 2024-04-26 DIAGNOSIS — M25572 Pain in left ankle and joints of left foot: Secondary | ICD-10-CM

## 2024-04-26 DIAGNOSIS — R262 Difficulty in walking, not elsewhere classified: Secondary | ICD-10-CM
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
## 2024-04-27 ENCOUNTER — Encounter: Payer: Self-pay | Admitting: Podiatry

## 2024-04-27 ENCOUNTER — Ambulatory Visit: Admitting: Podiatry

## 2024-04-27 VITALS — Ht 66.0 in | Wt 170.0 lb

## 2024-04-27 DIAGNOSIS — S86012D Strain of left Achilles tendon, subsequent encounter: Secondary | ICD-10-CM | POA: Diagnosis not present

## 2024-04-27 NOTE — Progress Notes (Signed)
  Subjective:  Patient ID: Brian MALVA Sebastian Mickey., male    DOB: [birth year],  MRN: [id number]  Chief Complaint  Patient presents with   Post-op Follow-up    RM 4 pov #4 DOS 01/25/24 LT ACHILLES TENDON REPAIR. Pt states daily swelling of left foot and some intermittent pain after prolonged standing/walking.      53 y.o. male returns for post-op check.  Doing well still notes some intermittent pain and swelling but feels like he has progressed quite a bit and is back in regular shoe gear  Review of Systems: Negative except as noted in the HPI. Denies N/V/F/Ch.   Objective:  There were no vitals filed for this visit. Body mass index is 27.44 kg/m. Constitutional Well developed. Well nourished.  Vascular Foot warm and well perfused. Capillary refill normal to all digits.  Calf is soft and supple, no posterior calf or knee pain, negative Homans' sign  Neurologic Normal speech. Oriented to person, place, and time. Epicritic sensation to light touch grossly present bilaterally.  Dermatologic Incision well-healed not hypertrophic  Orthopedic: He has no pain at the surgical site.  He has good strength in plantarflexion, improved range of motion in dorsiflexion    Assessment:   1. Achilles rupture, left, subsequent encounter    Plan:  Patient was evaluated and treated and all questions answered.  S/p foot surgery right - Okay to continue with gentle therapy past neutral position I discussed with him body weight exercises past neutral in dorsiflexion I would not recommend yet but use of weights or Theravance is okay.  Regular shoe gear as tolerated.  I gave him an additional heel lift to use a single lift for now over the next few weeks and then can eliminate this lift as well.  We also discussed his return to work and he is scheduled for November 1.  He will trial his work type duties including stairs ladders and steps over the next few weeks and if not improving let me know if he is having  difficulty do this if we need to wait further.  May cease PT as tolerated when he reaches maximal benefit and can discharge to home exercise plan.  Return in 3 months to reevaluate  Return in about 3 months (around 07/28/2024) for f/u Achilles rupture 6 month.

## 2024-04-28 ENCOUNTER — Ambulatory Visit

## 2024-05-03 ENCOUNTER — Ambulatory Visit

## 2024-05-03 DIAGNOSIS — R262 Difficulty in walking, not elsewhere classified: Secondary | ICD-10-CM

## 2024-05-03 DIAGNOSIS — M25572 Pain in left ankle and joints of left foot: Secondary | ICD-10-CM
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
## 2024-05-05 ENCOUNTER — Encounter

## 2024-05-11 ENCOUNTER — Ambulatory Visit

## 2024-05-11 ENCOUNTER — Telehealth: Payer: Self-pay

## 2024-05-11 NOTE — Telephone Encounter (Signed)
 No show. Called patient who said he thought he kept the Friday appointment instead. Took the wrong day off the schedule. Will be able to make it to his next follow up appointment.   Has been walking 2-3 miles a day and is doing good.  Has even walked with his steel toed shoes.

## 2024-05-13 ENCOUNTER — Encounter

## 2024-05-17 ENCOUNTER — Ambulatory Visit: Attending: Podiatry

## 2024-05-17 DIAGNOSIS — R262 Difficulty in walking, not elsewhere classified: Secondary | ICD-10-CM | POA: Diagnosis present

## 2024-05-17 DIAGNOSIS — M25572 Pain in left ankle and joints of left foot: Secondary | ICD-10-CM | POA: Diagnosis present
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
## 2024-05-24 ENCOUNTER — Ambulatory Visit

## 2024-05-24 DIAGNOSIS — R262 Difficulty in walking, not elsewhere classified: Secondary | ICD-10-CM | POA: Diagnosis not present

## 2024-05-24 DIAGNOSIS — M25572 Pain in left ankle and joints of left foot: Secondary | ICD-10-CM
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
## 2024-05-31 ENCOUNTER — Ambulatory Visit

## 2024-05-31 DIAGNOSIS — M25572 Pain in left ankle and joints of left foot: Secondary | ICD-10-CM

## 2024-05-31 DIAGNOSIS — R262 Difficulty in walking, not elsewhere classified: Secondary | ICD-10-CM | POA: Diagnosis not present
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
## 2024-06-07 ENCOUNTER — Ambulatory Visit

## 2024-06-07 DIAGNOSIS — R262 Difficulty in walking, not elsewhere classified: Secondary | ICD-10-CM

## 2024-06-07 DIAGNOSIS — M25572 Pain in left ankle and joints of left foot: Secondary | ICD-10-CM
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
## 2024-07-27 ENCOUNTER — Ambulatory Visit: Admitting: Podiatry

## 2024-07-27 VITALS — Ht 66.0 in | Wt 170.0 lb

## 2024-07-27 DIAGNOSIS — S86012D Strain of left Achilles tendon, subsequent encounter: Secondary | ICD-10-CM | POA: Diagnosis not present

## 2024-07-27 NOTE — Progress Notes (Signed)
"  °  Subjective:  Patient ID: Brian MALVA Sebastian Mickey., male    DOB: 08-May-1971,  MRN: [id number]  Chief Complaint  Patient presents with   Post-op Follow-up    RM 2 Patient is here to f/u post-surgery achilles rupture of the left foot. Pt states no pain or discomfort in the left foot/achilles.       54 y.o. male returns for post-op check.  Doing well.  He still reports that he has altered sensation around the nerve block site  Review of Systems: Negative except as noted in the HPI. Denies N/V/F/Ch.   Objective:  There were no vitals filed for this visit. Body mass index is 27.44 kg/m. Constitutional Well developed. Well nourished.  Vascular Foot warm and well perfused. Capillary refill normal to all digits.  Calf is soft and supple, no posterior calf or knee pain, negative Homans' sign  Neurologic Normal speech. Oriented to person, place, and time. Epicritic sensation to light touch grossly present bilaterally.  Dermatologic Incision well-healed not hypertrophic  Orthopedic: He has no pain at the surgical site.  He has good strength in plantarflexion, improved range of motion in dorsiflexion 5-5 and nearly equivalent to his contralateral limb.  Muscle tone and bulk has improved in the calf    Assessment:   1. Achilles rupture, left, subsequent encounter    Plan:  Patient was evaluated and treated and all questions answered.  S/p foot surgery right Doing very well.  From a functional standpoint may be full activity without restrictions.  May return to see me as needed.  He does still note some decreased and change sensation from his injection site from the peripheral nerve block and sensory changes.  He does not appear to have any motor deficits due to this.  Will discuss with her anesthesia team if ultrasound or EMG/NCV will be of any value.  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   "

## 2024-08-01 ENCOUNTER — Encounter: Payer: Self-pay | Admitting: Podiatry
